# Patient Record
Sex: Male | Born: 1955 | Race: White | Hispanic: No | Marital: Married | Smoking: Never smoker
Health system: Southern US, Community
[De-identification: ages and names within clinical notes are randomized; demographics above are authoritative.]

## PROBLEM LIST (undated history)

## (undated) DIAGNOSIS — C189 Malignant neoplasm of colon, unspecified: Secondary | ICD-10-CM

## (undated) HISTORY — PX: EYE SURGERY: SHX253

## (undated) HISTORY — PX: SHOULDER ARTHROSCOPY: SHX128

## (undated) HISTORY — PX: COLONOSCOPY: SHX174

## (undated) HISTORY — DX: Malignant neoplasm of colon, unspecified: C18.9

## (undated) HISTORY — PX: COLON SURGERY: SHX602

## (undated) HISTORY — PX: KNEE ARTHROSCOPY: SUR90

---

## 2000-12-02 DIAGNOSIS — C189 Malignant neoplasm of colon, unspecified: Secondary | ICD-10-CM

## 2000-12-02 HISTORY — DX: Malignant neoplasm of colon, unspecified: C18.9

## 2001-06-19 ENCOUNTER — Encounter: Payer: Self-pay | Admitting: Gastroenterology

## 2001-06-19 ENCOUNTER — Encounter (INDEPENDENT_AMBULATORY_CARE_PROVIDER_SITE_OTHER): Payer: Self-pay | Admitting: Specialist

## 2001-06-19 ENCOUNTER — Inpatient Hospital Stay (HOSPITAL_COMMUNITY): Admission: EM | Admit: 2001-06-19 | Discharge: 2001-06-26 | Payer: Self-pay | Admitting: Gastroenterology

## 2001-06-19 ENCOUNTER — Encounter (INDEPENDENT_AMBULATORY_CARE_PROVIDER_SITE_OTHER): Payer: Self-pay

## 2001-07-10 ENCOUNTER — Ambulatory Visit (HOSPITAL_COMMUNITY): Admission: RE | Admit: 2001-07-10 | Discharge: 2001-07-10 | Payer: Self-pay | Admitting: Oncology

## 2001-07-10 ENCOUNTER — Encounter: Payer: Self-pay | Admitting: Oncology

## 2001-07-17 ENCOUNTER — Ambulatory Visit (HOSPITAL_BASED_OUTPATIENT_CLINIC_OR_DEPARTMENT_OTHER): Admission: RE | Admit: 2001-07-17 | Discharge: 2001-07-17 | Payer: Self-pay | Admitting: Surgery

## 2001-07-17 ENCOUNTER — Encounter: Payer: Self-pay | Admitting: Surgery

## 2002-01-22 ENCOUNTER — Ambulatory Visit (HOSPITAL_BASED_OUTPATIENT_CLINIC_OR_DEPARTMENT_OTHER): Admission: RE | Admit: 2002-01-22 | Discharge: 2002-01-22 | Payer: Self-pay | Admitting: Surgery

## 2002-06-28 ENCOUNTER — Ambulatory Visit (HOSPITAL_COMMUNITY): Admission: RE | Admit: 2002-06-28 | Discharge: 2002-06-28 | Payer: Self-pay | Admitting: Gastroenterology

## 2004-12-27 ENCOUNTER — Ambulatory Visit: Payer: Self-pay | Admitting: Gastroenterology

## 2005-01-03 ENCOUNTER — Ambulatory Visit: Payer: Self-pay | Admitting: Gastroenterology

## 2009-12-29 ENCOUNTER — Encounter (INDEPENDENT_AMBULATORY_CARE_PROVIDER_SITE_OTHER): Payer: Self-pay | Admitting: *Deleted

## 2010-01-04 ENCOUNTER — Encounter (INDEPENDENT_AMBULATORY_CARE_PROVIDER_SITE_OTHER): Payer: Self-pay | Admitting: *Deleted

## 2010-01-12 ENCOUNTER — Encounter (INDEPENDENT_AMBULATORY_CARE_PROVIDER_SITE_OTHER): Payer: Self-pay | Admitting: *Deleted

## 2010-01-15 ENCOUNTER — Ambulatory Visit: Payer: Self-pay | Admitting: Gastroenterology

## 2010-01-22 ENCOUNTER — Ambulatory Visit: Payer: Self-pay | Admitting: Gastroenterology

## 2010-01-23 ENCOUNTER — Ambulatory Visit: Payer: Self-pay | Admitting: Oncology

## 2011-01-01 NOTE — Miscellaneous (Signed)
Summary: rec col...as.  Clinical Lists Changes  Medications: Added new medication of MOVIPREP 100 GM  SOLR (PEG-KCL-NACL-NASULF-NA ASC-C) As directed - Signed Rx of MOVIPREP 100 GM  SOLR (PEG-KCL-NACL-NASULF-NA ASC-C) As directed;  #1 x 0;  Signed;  Entered by: Clide Cliff RN;  Authorized by: Louis Meckel MD;  Method used: Electronically to [facility]*, [address] [place], [place]  [address], Ph: [phone], Fax: [phone] Observations: Added new observation of ALLERGY REV: Done (01/15/2010 12:45)    Prescriptions: MOVIPREP 100 GM  SOLR (PEG-KCL-NACL-NASULF-NA ASC-C) As directed  #1 x 0   Entered by:   Clide Cliff RN   Authorized by:   Louis Meckel MD   Signed by:   Clide Cliff RN on 01/15/2010   Method used:   Electronically to        [facility]* (retail)       [address] [place], [place]  [address]       Ph: [phone]       Fax: [phone]   RxID:   [id number]

## 2011-01-01 NOTE — Letter (Signed)
Summary: Colonoscopy Letter  [facility]  [address] [place], [place] [address]   Phone: [phone]  Fax: [phone]      December 29, 2009 MRN: [id number]   Trevor Contreras [address] [place], [place]  [address]   Dear Mr. Remigio,   According to your medical record, it is time for you to schedule a Colonoscopy. The American Cancer Society recommends this procedure as a method to detect early colon cancer. Patients with a family history of colon cancer, or a personal history of colon polyps or inflammatory bowel disease are at increased risk.  This letter has beeen generated based on the recommendations made at the time of your procedure. If you feel that in your particular situation this may no longer apply, please contact our office.  Please call our office at [phone] to schedule this appointment or to update your records at your earliest convenience.  Thank you for cooperating with [place] to provide you with the very best care possible.   Sincerely,  Barbette Hair. Arlyce Dice, M.D.  [facility] Gastroenterology Division [phone]

## 2011-01-01 NOTE — Letter (Signed)
Summary: Previsit letter  [facility]  [address] [place], [place] [address]   Phone: [phone]  Fax: [phone]       01/04/2010 MRN: [id number]  Trevor Contreras [address] [place], [place]  [address]  Dear Mr. Pinon,  Welcome to the Gastroenterology Division at [facility].    You are scheduled to see a nurse for your pre-procedure visit on 01/15/2010 at 1:00PM on the 3rd floor at [facility], [address]  We ask that you try to arrive at our office 15 minutes prior to your appointment time to allow for check-in.  Your nurse visit will consist of discussing your medical and surgical history, your immediate family medical history, and your medications.    Please bring a complete list of all your medications or, if you prefer, bring the medication bottles and we will list them.  We will need to be aware of both prescribed and over the counter drugs.  We will need to know exact dosage information as well.  If you are on blood thinners (Coumadin, Plavix, Aggrenox, Ticlid, etc.) please call our office today/prior to your appointment, as we need to consult with your physician about holding your medication.   Please be prepared to read and sign documents such as consent forms, a financial agreement, and acknowledgement forms.  If necessary, and with your consent, a friend or relative is welcome to sit-in on the nurse visit with you.  Please bring your insurance card so that we may make a copy of it.  If your insurance requires a referral to see a specialist, please bring your referral form from your primary care physician.  No co-pay is required for this nurse visit.     If you cannot keep your appointment, please call [phone] to cancel or reschedule prior to your appointment date.  This allows [place] the opportunity to schedule an appointment for another patient in need of care.    Thank you for choosing [facility] for your medical  needs.  We appreciate the opportunity to care for you.  Please visit [place] at our website  to learn more about our practice.                     Sincerely.                                                                                                                   The Gastroenterology Division

## 2011-01-01 NOTE — Procedures (Signed)
Summary: Colonoscopy  Patient: Trevor Contreras Note: All result statuses are Final unless otherwise noted.  Tests: (1) Colonoscopy (COL)   COL Colonoscopy           DONE     [facility]     [address]     [place], [place]  [address]           COLONOSCOPY PROCEDURE REPORT           PATIENT:  Trevor, Contreras  MR#:  [id number]     BIRTHDATE:  1956/09/21, 53 yrs. old  GENDER:  male           ENDOSCOPIST:  Quinten Allerton. Arlyce Dice, MD     Referred by:           PROCEDURE DATE:  01/22/2010     PROCEDURE:  Colonoscopy, Diagnostic     ASA CLASS:  Class II     INDICATIONS:  history of colon cancer Index tumor removed 2002           MEDICATIONS:   Fentanyl 100 mcg IV, Versed 10 mg IV, Benadryl 25     mg IV           DESCRIPTION OF PROCEDURE:   After the risks benefits and     alternatives of the procedure were thoroughly explained, informed     consent was obtained.  Digital rectal exam was performed and     revealed no abnormalities.   The LB CF-[id number] endoscope     was introduced through the anus and advanced to the anastamosis,     without limitations.  The quality of the prep was excellent, using     MoviPrep.  The instrument was then slowly withdrawn as the colon     was fully examined.     <<PROCEDUREIMAGES>>           FINDINGS:  A normal appearing cecum, ileocecal valve, and     appendiceal orifice were identified. The ascending, hepatic     flexure, transverse, splenic flexure, descending, sigmoid colon,     and rectum appeared unremarkable (see image1, image2, image3,     image4, image5, image7, image10, image13, image14, and image16).     Retroflexed views in the rectum revealed no abnormalities.    The     scope was then withdrawn from the patient and the procedure     completed.           COMPLICATIONS:  None           ENDOSCOPIC IMPRESSION:     1) Normal colon     RECOMMENDATIONS:     1) colonoscopy     5 years           REPEAT EXAM:  In 5 year(s) for  Colonoscopy.           ______________________________     Barbette Hair. Arlyce Dice, MD           CC:  Rolm Baptise, MDDonald Christell Constant, MD           n.     Rosalie Doctor:   Barbette Hair. Kaplan at 01/22/2010 10:50 AM           Ashley Jacobs, [id number]  Note: An exclamation mark (!) indicates a result that was not dispersed into the flowsheet. Document Creation Date: 01/22/2010 10:51 AM _______________________________________________________________________  (1) Order result status: Final Collection or observation date-time: 01/22/2010 10:40 Requested date-time:  Receipt date-time:  Reported date-time:  Referring Physician:   Ordering Physician: Melvia Heaps [id number]) Specimen Source:  Source: Launa Grill Order Number: [id number] Lab site:   Appended Document: Colonoscopy    Clinical Lists Changes  Observations: Added new observation of COLONNXTDUE: 01/2015 (01/22/2010 11:06)      Appended Document: Colonoscopy     Procedures Next Due Date:    Colonoscopy: 01/2015

## 2011-01-01 NOTE — Letter (Signed)
Summary: [facility] Instructions  [facility]  [address] [place], [place] [address]   Phone: [phone]  Fax: [phone]       Trevor Contreras    08-31-1956    MRN: [id number]        Procedure Day Dorna Bloom:  Duanne Limerick  01/22/10     Arrival Time:  9:00AM     Procedure Time:  10:00AM     Location of Procedure:                    Juliann Pares _  [facility] (4th Floor)                        PREPARATION FOR COLONOSCOPY WITH MOVIPREP   Starting 5 days prior to your procedure 01/17/10 do not eat nuts, seeds, popcorn, corn, beans, peas,  salads, or any raw vegetables.  Do not take any fiber supplements (e.g. Metamucil, Citrucel, and Benefiber).  THE DAY BEFORE YOUR PROCEDURE         DATE: 01/21/10  DAY:  SUNDAY  1.  Drink clear liquids the entire day-NO SOLID FOOD  2.  Do not drink anything colored red or purple.  Avoid juices with pulp.  No orange juice.  3.  Drink at least 64 oz. (8 glasses) of fluid/clear liquids during the day to prevent dehydration and help the prep work efficiently.  CLEAR LIQUIDS INCLUDE: Water Jello Ice Popsicles Tea (sugar ok, no milk/cream) Powdered fruit flavored drinks Coffee (sugar ok, no milk/cream) Gatorade Juice: apple, white grape, white cranberry  Lemonade Clear bullion, consomm, broth Carbonated beverages (any kind) Strained chicken noodle soup Hard Candy                             4.  In the morning, mix first dose of MoviPrep solution:    Empty 1 Pouch A and 1 Pouch B into the disposable container    Add lukewarm drinking water to the top line of the container. Mix to dissolve    Refrigerate (mixed solution should be used within 24 hrs)  5.  Begin drinking the prep at 5:00 p.m. The MoviPrep container is divided by 4 marks.   Every 15 minutes drink the solution down to the next mark (approximately 8 oz) until the full liter is complete.   6.  Follow completed prep with 16 oz of clear liquid of your choice (Nothing  red or purple).  Continue to drink clear liquids until bedtime.  7.  Before going to bed, mix second dose of MoviPrep solution:    Empty 1 Pouch A and 1 Pouch B into the disposable container    Add lukewarm drinking water to the top line of the container. Mix to dissolve    Refrigerate  THE DAY OF YOUR PROCEDURE      DATE: 01/22/10  DAY: MONDAY  Beginning at 5:00AM (5 hours before procedure):         1. Every 15 minutes, drink the solution down to the next mark (approx 8 oz) until the full liter is complete.  2. Follow completed prep with 16 oz. of clear liquid of your choice.    3. You may drink clear liquids until 8:00AM (2 HOURS BEFORE PROCEDURE).   MEDICATION INSTRUCTIONS  Unless otherwise instructed, you should take regular prescription medications with a small sip of water   as early as possible the  morning of your procedure.         OTHER INSTRUCTIONS  You will need a responsible adult at least 57 years of age to accompany you and drive you home.   This person must remain in the waiting room during your procedure.  Wear loose fitting clothing that is easily removed.  Leave jewelry and other valuables at home.  However, you may wish to bring a book to read or  an iPod/MP3 player to listen to music as you wait for your procedure to start.  Remove all body piercing jewelry and leave at home.  Total time from sign-in until discharge is approximately 2-3 hours.  You should go home directly after your procedure and rest.  You can resume normal activities the  day after your procedure.  The day of your procedure you should not:   Drive   Make legal decisions   Operate machinery   Drink alcohol   Return to work  You will receive specific instructions about eating, activities and medications before you leave.    The above instructions have been reviewed and explained to me by   Clide Cliff, RN_______________________    I fully understand and can  verbalize these instructions _____________________________ Date _________

## 2011-04-19 NOTE — Op Note (Signed)
[facility]  Patient:    Trevor Contreras, Trevor Contreras Visit Number: [id number] MRN: [id number]          Service Type: DSU Location: [facility] Attending Physician:  Charlton Haws Dictated by:   Currie Paris, M.D. Proc. Date: 01/22/02 Admit Date:  01/22/2002                             Operative Report  PREOPERATIVE DIAGNOSIS:  Unneeded Port-A-Cath.  POSTOPERATIVE DIAGNOSIS:  Unneeded Port-A-Cath.  OPERATION:  Removal of Port-A-Cath.  SURGEON:  Currie Paris, M.D.  ANESTHESIA:  MAC.  CLINICAL HISTORY:  This patient is a 55 year old, who has finished chemotherapy for colon cancer and needed to have his IV access removed.  DESCRIPTION OF PROCEDURE:  The patient was seen in the holding area and had no further questions.  He was taken to the operating room and given some IV sedation.  The area was prepped and draped.  A combination of 1% plain Xylocaine mixed equally with 0.5% Marcaine with epinephrine was used for local and infiltrated around the Port-A-Cath.  The old scar was excised and the Port-A-Cath reservoir identified in its little pocket.  The tubing was backed out partially and a 3-0 Vicryl used to occlude the tubing tract.  The tubing was then removed completely and the purse-string tied down.  The three Prolene sutures holding the Port-A-Cath in were cut and the Port-A-Cath removed intact.  The site was then cauterized a little bit to get the cavity to be a little more adherent and then closed in layers with 3-0 Vicryl followed by 4-0 Monocryl subcuticular plus Steri-Strips.  The patient tolerated the procedure well.  There were no operative complications.  All counts were correct. Dictated by:   Currie Paris, M.D. Attending Physician:  Charlton Haws DD:  01/22/02 TD:  01/22/02 Job: [id number]

## 2011-04-19 NOTE — Discharge Summary (Signed)
[facility]  Patient:    Trevor Contreras, Trevor Contreras                          MRN: [id number] Adm. Date:  [id number] Disc. Date: [id number] Attending:  Charlton Haws CC:         Barbette Hair. Arlyce Dice, M.D. [facility]  Monica Becton, M.D.  Leighton Roach. Truett Perna, M.D.   Discharge Summary  ACCOUNT NUMBER:  [id number]  FINAL DIAGNOSES: 1. Carcinoma of the ascending colon (T3, N0, M0). 2. Blood loss anemia secondary to #1.  CLINICAL HISTORY:  Mr. Thain presented to the physician and was admitted on July 19 with fatigue, shortness of breath, and was found to have a marked anemia.  Details are noted in the admission note.  He was begun on transfusions and, the next day, colonoscopy was performed which showed carcinoma of the ascending colon.  He was seen in surgical consultation and because his bowel prep was completed, he was taken to the operating room on Sunday, July 21, where a right hemicolectomy was done.  At the time of surgery, there was no evidence for any metastatic disease.  Postoperatively, he had an uneventful course except for he had a little nausea the first night but cleared up.  He is managed on PCA Dilaudid.  He began passing gas within two days of surgery, was able to start on some sips of liquids and gradually advanced to a solid diet.  By the fifth postoperative day, he had remained afebrile, was tolerating oral pain medicines tolerating a solid diet, having bowel movements, and able to go home.  He was seen in oncology consultation with follow-up plans made and possible chemotherapy as may be needed after further evaluation and discussion.  Pathology did show a T3 lesion, negative nodes.  This was discussed and reviewed with the patient.  Laboratory studies this admission included as noted his initial hemoglobin around 6.  After four units of blood and just prior to discharge, his hemoglobin had come up to 10.1.  The first reported hemoglobin here  was actually 5.7.  Electrolytes were normal on admission.  Pro time and PTT were unremarkable.  Liver functions were normal.  Iron was less than 10.  CEA was 0.5.  Urinalysis was clear.  EKG showed sinus bradycardia but was otherwise unremarkable.  Chest x-ray done on admission showed no evidence for metastatic disease or other cardiopulmonary issues.  The patient is discharged in satisfactory condition, limited activities, follow-up in my office in two weeks.  Follow up by Dr. Danielle Dess office as they will arrange.  He is given Tylox for pain. DD:  06/26/01 TD:  06/27/01 Job: [id number]

## 2011-04-19 NOTE — H&P (Signed)
[facility]  Patient:    Trevor Contreras, Trevor Contreras                          MRN: [id number] Adm. Date:  06/19/01 Attending:  Barbette Hair. Arlyce Dice, M.D. [facility] Dictator:   Mike Gip, P.A.-C. CC:         Monica Becton, M.D. [facility])   History and Physical  DATE OF BIRTH:  [birth year].  CHIEF COMPLAINT:  Fatigue and exertional shortness of breath with intermittent palpitations.  HISTORY:  Trevor Contreras is a healthy 55 year old white male, medical patient of Dr. Christell Constant in [place], with no known history of chronic medical problems, who presented to the primary care providers about three months ago with general complaints of fatigue.  He did have some laboratories drawn at that time which were unremarkable, but no CBC was done.  Now, with progressive complaint of exertional shortness of breath, fatigue, and decreased exercise tolerance.  He has been a runner long-term, usually running around four miles per day.  He says he has been able to run "to the end of the driveway" over the past several weeks and has been trying to use his treadmill, but gets very short of breath and fatigued and is unable to go beyond one-quarter of a mile.  He has had no definite chest pain, but has had some discomfort between his shoulder blades at times.  He was seen in Dr. Buel Ream office today because of his progressive fatigue.  A CBC was done showing a hemoglobin of 6 and hematocrit of 20.6, MCV of 59.7, MCHC of 29.2, MCH of 17.4, platelet count 234, WBC 4.2. GI was called and the patient is admitted at this time for diagnostic evaluation.  He has no complaints of abdominal pain, change in his bowel habits, nausea, vomiting, dysphagia, heartburn, indigestion.  His appetite has been fine and his weight is stable.  He has not noted any change in the consistency of this stools.  He says that he has seen some darker bowel movements at times, but had attributed this to diet and had not  definitely seen any melena or hematochezia.  He was hemoccult negative today in Dr. Buel Ream office.  The patient does complain of intermittent palpitations, which "jolt him".  He says this has been going on intermittently for over a year and that he did apparently have a Holter done at one time which was negative.  He says the palpitations have been more frequent over the past couple of months.  He also notes that he has been craving ice over the past few months and has to have a cup of ice by him at all times.  CURRENT MEDICATIONS:  None regular.  No regular aspirin or NSAIDs.  ALLERGIES:  No known drug allergies.  PAST MEDICAL HISTORY: 1. Left knee arthroscopy. 2. Current asymptomatic left inguinal hernia.  FAMILY HISTORY:  Father deceased with lung cancer.  There is no family history of colon cancer or polyps or anemia.  SOCIAL HISTORY:  The patient is married.  He has two children.  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  No tobacco and no ETOH. Again, he has been a runner long-term.  REVIEW OF SYSTEMS:  CARDIOVASCULAR:  Negative for chest pain or anginal symptoms.  PULMONARY:  Negative for cough. The patient does have exertional dyspnea.  No sputum production.  GENITOURINARY:  Negative dysuria, urgency, or frequency.  MUSCULOSKELETAL:  Negative for arthralgias or myalgias.  PHYSICAL EXAMINATION:  GENERAL:  Well-developed white male in no acute distress.  He is alert and oriented x 3.  He is pale.  VITAL SIGNS:  Temperature 97.6, blood pressure 108/60, pulse 63.  O2 saturation 99%.  HEENT:  Nontraumatic, normocephalic.  EOMI.  PERRLA.  Sclerae anicteric.  NECK:  Supple, without nodes.  No JVD or bruits.  CARDIOVASCULAR:  Regular rate and rhythm, with S1 and S2.  PULMONARY:  Clear to A&P.  ABDOMEN:  Soft.  Bowel sounds are active.  He is nontender.  There is no palpable mass or hepatosplenomegaly.  RECTAL:  Heme negative earlier today and not  repeated.  EXTREMITIES:  Without clubbing, cyanosis, or edema.  NEUROLOGICAL:  Grossly nonfocal.  LABORATORY DATA:  Pending.  IMPRESSION: 1. A 55 year old white male with severe microcytic anemia, symptomatic with    fatigue, exertional dyspnea, and increased palpitations.  Rule out occult    colon or gastric lesion or small-bowel lesion, i.e. AVMs, etc. Rule out    nongastrointestinal course with myelodysplastic process. 2. Palpitations.  Question premature ventricular contractions, question    intermittent arrhythmia. 3. Status post left knee arthroscopy. 4. Left inguinal hernia. 5. Pica, with ice craving secondary to iron deficiency.  PLAN:  The patient is admitted to the service of Dr. Melvia Heaps.  He will be transfused two units of packed rbcs.  We will check baseline laboratories, including iron studies prior to transfusion.  We will plan colonoscopy and upper endoscopy in a.m. with Dr. Arlyce Dice.  If this is negative, he will need small-bowel evaluation and if this is negative, then hematology consultation. The patient will be placed on telemetry.  If we document an arrhythmia, we will obtain cardiology consultation.  Check chest x-ray.  For further details, please see the orders. DD:  06/19/01 TD:  06/20/01 Job: [id number]

## 2011-04-19 NOTE — Consult Note (Signed)
[facility]  Patient:    Trevor Contreras, Trevor Contreras                          MRN: [id number] Proc. Date: 06/20/01 Adm. Date:  [id number] Attending:  Judeth Cornfield CC:         Barbette Hair. Arlyce Dice, M.D. [facility]  Monica Becton, M.D.   Consultation Report  ACCOUNT NUMBER:  [id number]  REASON FOR CONSULTATION:  Cancer of the right colon.  CLINICAL HISTORY:  I was asked to see Trevor Contreras because of recently found cancer of the right colon.  Trevor Contreras is a 55 year old gentleman who has had gradual onset of fatigue. Around the first of the year, he was able to run four miles without problem but gradually was able to run less and less and became more and more short of breath when he was running, finally was not able to run even a mile; he also developed a craving for ice.  He has had no abdominal pain.  He has noticed no blood in his stools.  He has not thrown up any blood.  He has had no other GI symptoms, no change in his bowel habits, etc.  He has developed progressive exertional shortness of breath and fatigue, was evaluated and found to have a marked anemia with a hemoglobin of approximately 6.  He was admitted for diagnostic evaluation and therapy.  MEDICATIONS:  He has been on no medications.  ALLERGIES:  He has no known allergies.  PAST SURGICAL HISTORY:  He did have a left knee arthroscopy.  REVIEW OF SYSTEMS:  HEENT:  Negative.  CHEST:  No cough or shortness of breath.  HEART:  Negative.  No history of heart murmur, although he did have some recent development of palpitations.  ABDOMEN:  He does note a left inguinal hernia.  FAMILY HISTORY:  He had a father who died of lung cancer but otherwise negative.  PHYSICAL EXAMINATION:  GENERAL:  Patient is a well-developed, well-nourished man who is in no distress.  VITAL SIGNS:  Vital signs have been stable since admission.  His pulse is slow and regular.  HEENT:  Head is normocephalic.  Eyes nonicteric.   Pupils round and regular.  NECK:  Supple.  No masses or thyromegaly.  LUNGS:  Clear to auscultation.  HEART:  Regular.  No murmurs, rubs, or gallops.  ABDOMEN:  Soft and completely nontender.  No masses or organomegaly.  Markedly increased bowel sounds (probably secondary to the recent colonoscopy this morning).  EXTREMITIES:  No cyanosis or edema.  LABORATORY STUDIES:  Laboratory studies other than the anemia are basically unremarkable.  Colonoscopic findings are noted with Dr. Barbette Hair. Arlyce Dice and he has a lesion in the ascending colon which appears to be malignant, approximately 3 cm in size.  Biopsies were taken.  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  RECOMMENDATION:  He will need a right colectomy.  Whether or not this is malignant, it will need to come out as he has developed anemia from this. From the photos, it does appear to me to be malignant.  I have discussed the surgery including right colectomy, the risks and complications including bleeding, infection, leaking anastomoses, bowel obstruction, etc, as well as cardiac and pulmonary complications that might occur.  All questions are answered and he is anxious to proceed to surgery.  I have told him without surgery, he will continue to have blood loss and will  eventually have obstruction-type problems.  We will go ahead and schedule him tomorrow (Sunday).  He has already had a bowel prep and I think that Mondays schedule looks full and would be just as prudent to go ahead and get him done before we have additional blood loss. DD:  06/20/01 TD:  06/22/01 Job: [id number] [id number]

## 2013-05-27 ENCOUNTER — Telehealth: Payer: Self-pay | Admitting: Physician Assistant

## 2013-05-31 ENCOUNTER — Ambulatory Visit (INDEPENDENT_AMBULATORY_CARE_PROVIDER_SITE_OTHER): Payer: BC Managed Care – PPO | Admitting: Physician Assistant

## 2013-05-31 ENCOUNTER — Encounter: Payer: Self-pay | Admitting: Physician Assistant

## 2013-05-31 VITALS — BP 112/72 | HR 64 | Temp 98.0°F | Wt 206.0 lb

## 2013-05-31 DIAGNOSIS — D649 Anemia, unspecified: Secondary | ICD-10-CM

## 2013-05-31 DIAGNOSIS — C189 Malignant neoplasm of colon, unspecified: Secondary | ICD-10-CM | POA: Insufficient documentation

## 2013-05-31 DIAGNOSIS — R0602 Shortness of breath: Secondary | ICD-10-CM

## 2013-05-31 DIAGNOSIS — R5383 Other fatigue: Secondary | ICD-10-CM

## 2013-05-31 DIAGNOSIS — R5381 Other malaise: Secondary | ICD-10-CM

## 2013-05-31 LAB — POCT CBC
Granulocyte percent: 60.7 %G (ref 37–80)
HCT, POC: 39.4 % — AB (ref 43.5–53.7)
Hemoglobin: 14 g/dL — AB (ref 14.1–18.1)
Lymph, poc: 1.7 (ref 0.6–3.4)
MCH, POC: 31 pg (ref 27–31.2)
MCHC: 35.7 g/dL — AB (ref 31.8–35.4)
MCV: 87.2 fL (ref 80–97)
MPV: 8.1 fL (ref 0–99.8)
POC Granulocyte: 3.3 (ref 2–6.9)
POC LYMPH PERCENT: 30.9 %L (ref 10–50)
Platelet Count, POC: 148 10*3/uL (ref 142–424)
RBC: 4.5 M/uL — AB (ref 4.69–6.13)
RDW, POC: 14.3 %
WBC: 5.5 10*3/uL (ref 4.6–10.2)

## 2013-05-31 NOTE — Patient Instructions (Signed)

## 2013-05-31 NOTE — Progress Notes (Signed)
Subjective:     Patient ID: Trevor Contreras, male   DOB: [birth year], 57 y.o.   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       Objective:   Physical Exam  Nursing note and vitals reviewed. NAD No bruits/JVD Heart- RRR w/o M Lungs- CTA bilat Pulses equal in upper ext No lower ext edema CBC- nl in office BMP, LFT, TSH pending     Assessment:     1. Shortness of breath   2. Other malaise and fatigue   3. Colon cancer        Plan:     Cont to observe If pt cont with increase in irreg heartbeat bouts- stress testing Will inform of lab results Keep well hydrated F/U pending labs

## 2013-06-01 DIAGNOSIS — D649 Anemia, unspecified: Secondary | ICD-10-CM | POA: Insufficient documentation

## 2013-06-01 LAB — BASIC METABOLIC PANEL WITH GFR
BUN: 23 mg/dL (ref 6–23)
CO2: 27 mEq/L (ref 19–32)
Calcium: 9.5 mg/dL (ref 8.4–10.5)
Chloride: 104 mEq/L (ref 96–112)
Creat: 0.91 mg/dL (ref 0.50–1.35)
GFR, Est African American: >89 mL/min
GFR, Est Non African American: >89 mL/min
Glucose, Bld: 86 mg/dL (ref 70–99)
Potassium: 5.1 mEq/L (ref 3.5–5.3)
Sodium: 137 mEq/L (ref 135–145)

## 2013-06-01 LAB — HEPATIC FUNCTION PANEL
ALT: 19 U/L (ref 0–53)
AST: 20 U/L (ref 0–37)
Albumin: 4 g/dL (ref 3.5–5.2)
Alkaline Phosphatase: 67 U/L (ref 39–117)
Bilirubin, Direct: 0.1 mg/dL (ref 0.0–0.3)
Indirect Bilirubin: 0.5 mg/dL (ref 0.0–0.9)
Total Bilirubin: 0.6 mg/dL (ref 0.3–1.2)
Total Protein: 6.6 g/dL (ref 6.0–8.3)

## 2013-06-01 NOTE — Addendum Note (Signed)
Addended by: Inis Sizer on: 06/01/2013 12:45 PM   Modules accepted: Orders

## 2013-07-05 ENCOUNTER — Telehealth: Payer: Self-pay | Admitting: *Deleted

## 2013-07-05 NOTE — Telephone Encounter (Signed)
Called to follow-up on patient from previous visit with Montey Hora, PA-C.   Patient was suppose to see his GI specialist and I want to see if he has.   Left message for patient to return call.

## 2013-07-05 NOTE — Telephone Encounter (Signed)
Spoke with patient and he is doing well. He has not follow-up with his GI specialist yet. He will let us know if he needs any further assistance.

## 2014-07-19 ENCOUNTER — Ambulatory Visit (INDEPENDENT_AMBULATORY_CARE_PROVIDER_SITE_OTHER): Payer: BC Managed Care – PPO | Admitting: Family

## 2014-07-19 ENCOUNTER — Encounter: Payer: Self-pay | Admitting: Family

## 2014-07-19 VITALS — BP 116/72 | HR 60 | Temp 97.5°F | Ht 73.0 in | Wt 216.0 lb

## 2014-07-19 DIAGNOSIS — H8110 Benign paroxysmal vertigo, unspecified ear: Secondary | ICD-10-CM

## 2014-07-19 DIAGNOSIS — Z125 Encounter for screening for malignant neoplasm of prostate: Secondary | ICD-10-CM

## 2014-07-19 DIAGNOSIS — Z1321 Encounter for screening for nutritional disorder: Secondary | ICD-10-CM

## 2014-07-19 LAB — POCT GLYCOSYLATED HEMOGLOBIN (HGB A1C): HEMOGLOBIN A1C: 5.5

## 2014-07-19 MED ORDER — MECLIZINE HCL 25 MG PO TABS: 25.0000 mg | ORAL_TABLET | Freq: Three times a day (TID) | ORAL | Status: DC | PRN

## 2014-07-19 NOTE — Progress Notes (Signed)
   Subjective:    Patient ID: Kebron Pulse, male    DOB: November 21, 1956, 58 y.o.   MRN: [id number]  Dizziness This is a new problem. The current episode started today. The problem occurs intermittently. The problem has been unchanged. Associated symptoms include congestion, headaches and vertigo. Pertinent negatives include no abdominal pain, chest pain, chills, coughing, fatigue, fever, joint swelling, nausea, sore throat, urinary symptoms or vomiting. He has tried nothing for the symptoms. The treatment provided no relief.    *Pt states this AM he was "seeing double" and felt sweaty.   Review of Systems  Constitutional: Negative.  Negative for fever, chills and fatigue.  HENT: Positive for congestion. Negative for sore throat.   Respiratory: Negative.  Negative for cough.   Cardiovascular: Negative.  Negative for chest pain.  Gastrointestinal: Negative.  Negative for nausea, vomiting and abdominal pain.  Endocrine: Negative.   Genitourinary: Negative.   Musculoskeletal: Negative.  Negative for joint swelling.  Neurological: Positive for dizziness, vertigo and headaches.  Hematological: Negative.   Psychiatric/Behavioral: Negative.   All other systems reviewed and are negative.      Objective:   Physical Exam  Vitals reviewed. Constitutional: He is oriented to person, place, and time. He appears well-developed and well-nourished. No distress.  HENT:  Head: Normocephalic.  Right Ear: External ear normal.  Left Ear: External ear normal.  Nose: Nose normal.  Mouth/Throat: Oropharynx is clear and moist.  Eyes: Pupils are equal, round, and reactive to light. Right eye exhibits no discharge. Left eye exhibits no discharge.  Neck: Normal range of motion. Neck supple. No thyromegaly present.  Cardiovascular: Normal rate, regular rhythm, normal heart sounds and intact distal pulses.   No murmur heard. Pulmonary/Chest: Effort normal and breath sounds normal. No respiratory distress. He has  no wheezes.  Abdominal: Soft. Bowel sounds are normal. He exhibits no distension. There is no tenderness.  Musculoskeletal: Normal range of motion. He exhibits no edema and no tenderness.  Neurological: He is alert and oriented to person, place, and time. He has normal reflexes. No cranial nerve deficit.  Skin: Skin is warm and dry. No rash noted. No erythema.  Psychiatric: He has a normal mood and affect. His behavior is normal. Judgment and thought content normal.    BP 116/72  Pulse 60  Temp(Src) 97.5 F (36.4 C) (Oral)  Ht $R'6\' 1"'yu$  (1.854 m)  Wt 216 lb (97.977 kg)  BMI 28.50 kg/m2       Assessment & Plan:  1. Benign paroxysmal positional vertigo, unspecified laterality - CMP14+EGFR - meclizine (ANTIVERT) 25 MG tablet; Take 1 tablet (25 mg total) by mouth 3 (three) times daily as needed for dizziness.  Dispense: 30 tablet; Refill: 0  2. Prostate cancer screening - PSA, total and free  3. Encounter for vitamin deficiency screening - Vit D  25 hydroxy (rtn osteoporosis monitoring)   Labs pending Diet and exercise encouraged RTO for annual physical  Evelina Dun, FNP

## 2014-07-19 NOTE — Patient Instructions (Signed)

## 2014-07-20 LAB — ANEMIA PROFILE B
BASOS: 1 %
Basophils Absolute: 0.1 10*3/uL (ref 0.0–0.2)
EOS: 2 %
Eosinophils Absolute: 0.1 10*3/uL (ref 0.0–0.4)
Ferritin: 90 ng/mL (ref 30–400)
Folate: >19.9 ng/mL (ref >3.0)
HCT: 40.4 % (ref 37.5–51.0)
HEMOGLOBIN: 13.7 g/dL (ref 12.6–17.7)
IMMATURE GRANS (ABS): 0 10*3/uL (ref 0.0–0.1)
IMMATURE GRANULOCYTES: 0 %
Iron Saturation: 21 % (ref 15–55)
Iron: 67 ug/dL (ref 40–155)
LYMPHS ABS: 1.9 10*3/uL (ref 0.7–3.1)
Lymphs: 35 %
MCH: 29.5 pg (ref 26.6–33.0)
MCHC: 33.9 g/dL (ref 31.5–35.7)
MCV: 87 fL (ref 79–97)
MONOCYTES: 11 %
MONOS ABS: 0.6 10*3/uL (ref 0.1–0.9)
NEUTROS PCT: 51 %
Neutrophils Absolute: 2.8 10*3/uL (ref 1.4–7.0)
PLATELETS: 199 10*3/uL (ref 150–379)
RBC: 4.65 x10E6/uL (ref 4.14–5.80)
RDW: 14.3 % (ref 12.3–15.4)
Retic Ct Pct: 1.3 % (ref 0.6–2.6)
TIBC: 321 ug/dL (ref 250–450)
UIBC: 254 ug/dL (ref 150–375)
Vitamin B-12: 577 pg/mL (ref 211–946)
WBC: 5.5 10*3/uL (ref 3.4–10.8)

## 2014-07-20 LAB — CMP14+EGFR
ALBUMIN: 4.4 g/dL (ref 3.5–5.5)
ALT: 18 IU/L (ref 0–44)
AST: 19 IU/L (ref 0–40)
Albumin/Globulin Ratio: 1.9 (ref 1.1–2.5)
Alkaline Phosphatase: 61 IU/L (ref 39–117)
BUN / CREAT RATIO: 19 (ref 9–20)
BUN: 19 mg/dL (ref 6–24)
CALCIUM: 9.3 mg/dL (ref 8.7–10.2)
CHLORIDE: 99 mmol/L (ref 97–108)
CO2: 25 mmol/L (ref 18–29)
CREATININE: 1 mg/dL (ref 0.76–1.27)
GFR calc Af Amer: 95 mL/min/{1.73_m2} (ref >59)
GFR calc non Af Amer: 82 mL/min/{1.73_m2} (ref >59)
GLOBULIN, TOTAL: 2.3 g/dL (ref 1.5–4.5)
GLUCOSE: 88 mg/dL (ref 65–99)
Potassium: 4.7 mmol/L (ref 3.5–5.2)
Sodium: 140 mmol/L (ref 134–144)
TOTAL PROTEIN: 6.7 g/dL (ref 6.0–8.5)
Total Bilirubin: 0.4 mg/dL (ref 0.0–1.2)

## 2014-07-20 LAB — VITAMIN D 25 HYDROXY (VIT D DEFICIENCY, FRACTURES): Vit D, 25-Hydroxy: 44.3 ng/mL (ref 30.0–100.0)

## 2014-07-20 LAB — PSA, TOTAL AND FREE
PSA FREE PCT: 25.5 %
PSA FREE: 0.28 ng/mL
PSA: 1.1 ng/mL (ref 0.0–4.0)

## 2015-01-24 ENCOUNTER — Encounter: Payer: Self-pay | Admitting: Gastroenterology

## 2015-01-26 ENCOUNTER — Encounter: Payer: Self-pay | Admitting: Gastroenterology

## 2015-06-29 ENCOUNTER — Encounter: Payer: Self-pay | Admitting: Physician Assistant

## 2015-06-29 ENCOUNTER — Ambulatory Visit (INDEPENDENT_AMBULATORY_CARE_PROVIDER_SITE_OTHER): Payer: BC Managed Care – PPO | Admitting: Physician Assistant

## 2015-06-29 VITALS — BP 116/72 | HR 63 | Temp 97.3°F | Ht 73.0 in | Wt 213.0 lb

## 2015-06-29 DIAGNOSIS — B351 Tinea unguium: Secondary | ICD-10-CM | POA: Diagnosis not present

## 2015-06-29 DIAGNOSIS — J019 Acute sinusitis, unspecified: Secondary | ICD-10-CM | POA: Diagnosis not present

## 2015-06-29 DIAGNOSIS — J309 Allergic rhinitis, unspecified: Secondary | ICD-10-CM

## 2015-06-29 MED ORDER — EFINACONAZOLE 10 % EX SOLN: CUTANEOUS | Status: DC

## 2015-06-29 MED ORDER — MONTELUKAST SODIUM 10 MG PO TABS: 10.0000 mg | ORAL_TABLET | Freq: Every day | ORAL | Status: DC

## 2015-06-29 MED ORDER — FLUTICASONE PROPIONATE 50 MCG/ACT NA SUSP: 2.0000 | Freq: Every day | NASAL | Status: DC

## 2015-06-29 MED ORDER — AMOXICILLIN 875 MG PO TABS: 875.0000 mg | ORAL_TABLET | Freq: Two times a day (BID) | ORAL | Status: DC

## 2015-06-29 NOTE — Progress Notes (Signed)
   Subjective:    Patient ID: Trevor Contreras, male    DOB: [birth year], 59 y.o.   MRN: [id number]  HPI 59 y/o male presents with c/o nasal stuffiness and congestion x several months. He had an episode of severe dizziness 3 days ago and was taken by ambulance to [facility] CT scan showed sinusitis. He was not prescribed antibiotics, only Meclizine for the dizziness.   Patient also states that he has a h/o of afib and has an appt scheduled in August with a Cardiologist for further evaluation.     Review of Systems  Constitutional: Negative.   HENT: Positive for congestion (nasal ), postnasal drip, rhinorrhea, sinus pressure and sore throat.   Eyes: Negative.   Respiratory: Negative.   Cardiovascular: Negative.   Gastrointestinal: Negative.   Neurological: Positive for dizziness (past episodes ).       Objective:   Physical Exam  Constitutional: He is oriented to person, place, and time. He appears well-developed and well-nourished. No distress.  HENT:  Head: Normocephalic and atraumatic.  Right Ear: External ear normal.  Left Ear: External ear normal.  Injected posterior pharynx bilaterally  Nasal turbinates boggy and erythematous bilaterally   Cardiovascular: Normal rate, regular rhythm and normal heart sounds.  Exam reveals no gallop and no friction rub.   No murmur heard. Pulmonary/Chest: Effort normal and breath sounds normal. No respiratory distress. He has no wheezes. He has no rales. He exhibits no tenderness.  Musculoskeletal: He exhibits no edema or tenderness.  Neurological: He is alert and oriented to person, place, and time.  Skin: He is not diaphoretic.  Psychiatric: He has a normal mood and affect. His behavior is normal. Judgment and thought content normal.  Nursing note and vitals reviewed.         Assessment & Plan:  1. Onychomycosis of toenail  - Efinaconazole 10 % SOLN; Apply q day to nail x 48 weeks or until fungus clears  Dispense: 1  Bottle; Refill: 1  2. Acute sinusitis, recurrence not specified, unspecified location  - amoxicillin (AMOXIL) 875 MG tablet; Take 1 tablet (875 mg total) by mouth 2 (two) times daily.  Dispense: 20 tablet; Refill: 0  3. Allergic rhinitis, unspecified allergic rhinitis type  - montelukast (SINGULAIR) 10 MG tablet; Take 1 tablet (10 mg total) by mouth at bedtime.  Dispense: 30 tablet; Refill: 3 - fluticasone (FLONASE) 50 MCG/ACT nasal spray; Place 2 sprays into both nostrils daily.  Dispense: 16 g; Refill: 6   RTO prn if symptoms worsen or dni   Jaelin Fackler A. Benjamin Stain PA-C

## 2015-07-11 ENCOUNTER — Telehealth: Payer: Self-pay

## 2015-07-11 ENCOUNTER — Other Ambulatory Visit: Payer: Self-pay | Admitting: Physician Assistant

## 2015-07-11 DIAGNOSIS — B351 Tinea unguium: Secondary | ICD-10-CM

## 2015-07-11 MED ORDER — CICLOPIROX 8 % EX SOLN: Freq: Every day | CUTANEOUS | Status: DC

## 2015-07-11 NOTE — Telephone Encounter (Signed)
Prescription for Cicloprox sent to pharmacy  Bolton Canupp A. Benjamin Stain PA-C

## 2015-07-11 NOTE — Telephone Encounter (Signed)
Insurance denied prior authorization for Jublia  Must try and fail TWo of the following agents  Oral terbinafine oral itraconazole or topical cuclopirox solution

## 2015-07-12 NOTE — Telephone Encounter (Signed)
Patient aware that a new rx was sent to pharmacy.

## 2015-08-19 ENCOUNTER — Ambulatory Visit (INDEPENDENT_AMBULATORY_CARE_PROVIDER_SITE_OTHER): Payer: BC Managed Care – PPO | Admitting: Family

## 2015-08-19 ENCOUNTER — Encounter (INDEPENDENT_AMBULATORY_CARE_PROVIDER_SITE_OTHER): Payer: Self-pay

## 2015-08-19 ENCOUNTER — Encounter: Payer: Self-pay | Admitting: Family

## 2015-08-19 VITALS — BP 131/82 | HR 52 | Temp 97.0°F | Ht 73.0 in | Wt 213.0 lb

## 2015-08-19 DIAGNOSIS — Z125 Encounter for screening for malignant neoplasm of prostate: Secondary | ICD-10-CM

## 2015-08-19 DIAGNOSIS — H8113 Benign paroxysmal vertigo, bilateral: Secondary | ICD-10-CM

## 2015-08-19 MED ORDER — MECLIZINE HCL 50 MG PO TABS: 50.0000 mg | ORAL_TABLET | Freq: Three times a day (TID) | ORAL | Status: DC | PRN

## 2015-08-19 NOTE — Patient Instructions (Signed)
Benign Positional Vertigo Vertigo means you feel like you or your surroundings are moving when they are not. Benign positional vertigo is the most common form of vertigo. Benign means that the cause of your condition is not serious. Benign positional vertigo is more common in older adults. CAUSES  Benign positional vertigo is the result of an upset in the labyrinth system. This is an area in the middle ear that helps control your balance. This may be caused by a viral infection, head injury, or repetitive motion. However, often no specific cause is found. SYMPTOMS  Symptoms of benign positional vertigo occur when you move your head or eyes in different directions. Some of the symptoms may include:  Loss of balance and falls.  Vomiting.  Blurred vision.  Dizziness.  Nausea.  Involuntary eye movements (nystagmus). DIAGNOSIS  Benign positional vertigo is usually diagnosed by physical exam. If the specific cause of your benign positional vertigo is unknown, your caregiver may perform imaging tests, such as magnetic resonance imaging (MRI) or computed tomography (CT). TREATMENT  Your caregiver may recommend movements or procedures to correct the benign positional vertigo. Medicines such as meclizine, benzodiazepines, and medicines for nausea may be used to treat your symptoms. In rare cases, if your symptoms are caused by certain conditions that affect the inner ear, you may need surgery. HOME CARE INSTRUCTIONS   Follow your caregiver's instructions.  Move slowly. Do not make sudden body or head movements.  Avoid driving.  Avoid operating heavy machinery.  Avoid performing any tasks that would be dangerous to you or others during a vertigo episode.  Drink enough fluids to keep your urine clear or pale yellow. SEEK IMMEDIATE MEDICAL CARE IF:   You develop problems with walking, weakness, numbness, or using your arms, hands, or legs.  You have difficulty speaking.  You develop  severe headaches.  Your nausea or vomiting continues or gets worse.  You develop visual changes.  Your family or friends notice any behavioral changes.  Your condition gets worse.  You have a fever.  You develop a stiff neck or sensitivity to light. MAKE SURE YOU:   Understand these instructions.  Will watch your condition.  Will get help right away if you are not doing well or get worse. Document Released: 08/26/2006 Document Revised: 02/10/2012 Document Reviewed: 08/08/2011 Exit[employer] Patient Information 2015 [employer]. This information is not intended to replace advice given to you by your health care provider. Make sure you discuss any questions you have with your health care provider.    

## 2015-08-19 NOTE — Progress Notes (Signed)
   Subjective:    Patient ID: Trevor Contreras, male    DOB: 1956-03-19, 59 y.o.   MRN: [id number]  Dizziness This is a recurrent problem. The current episode started more than 1 month ago. The problem occurs intermittently. The problem has been waxing and waning. Associated symptoms include vertigo. Pertinent negatives include no anorexia, chest pain, chills, coughing, fever, nausea, numbness, sore throat or weakness. The symptoms are aggravated by bending. He has tried rest (antivert) for the symptoms. The treatment provided mild relief.      Review of Systems  Constitutional: Negative.  Negative for fever and chills.  HENT: Negative.  Negative for sore throat.   Respiratory: Negative.  Negative for cough.   Cardiovascular: Negative.  Negative for chest pain.  Gastrointestinal: Negative.  Negative for nausea and anorexia.  Endocrine: Negative.   Genitourinary: Negative.   Musculoskeletal: Negative.   Neurological: Positive for dizziness and vertigo. Negative for weakness and numbness.  Hematological: Negative.   Psychiatric/Behavioral: Negative.   All other systems reviewed and are negative.      Objective:   Physical Exam  Constitutional: He is oriented to person, place, and time. He appears well-developed and well-nourished. No distress.  HENT:  Head: Normocephalic.  Eyes: Pupils are equal, round, and reactive to light. Right eye exhibits no discharge. Left eye exhibits no discharge.  Neck: Normal range of motion. Neck supple. No thyromegaly present.  Cardiovascular: Normal rate, regular rhythm, normal heart sounds and intact distal pulses.   No murmur heard. Pulmonary/Chest: Effort normal and breath sounds normal. No respiratory distress. He has no wheezes.  Abdominal: Soft. Bowel sounds are normal. He exhibits no distension. There is no tenderness.  Musculoskeletal: Normal range of motion. He exhibits no edema or tenderness.  Neurological: He is alert and oriented to person,  place, and time. He has normal reflexes. No cranial nerve deficit.  Skin: Skin is warm and dry. No rash noted. No erythema.  Psychiatric: He has a normal mood and affect. His behavior is normal. Judgment and thought content normal.  Vitals reviewed.   BP 131/82 mmHg  Pulse 52  Temp(Src) 97 F (36.1 C) (Oral)  Ht $R'6\' 1"'vq$  (1.854 m)  Wt 213 lb (96.616 kg)  BMI 28.11 kg/m2       Assessment & Plan:  1. BPPV (benign paroxysmal positional vertigo), bilateral -Falls precaution discussed -Avoid bending and fast movements -Different exercises for vertigo discussed and handout given - CBC with Differential/Platelet - Ambulatory referral to ENT - meclizine (ANTIVERT) 50 MG tablet; Take 1 tablet (50 mg total) by mouth 3 (three) times daily as needed.  Dispense: 30 tablet; Refill: 0 - CMP14+EGFR  2. Prostate cancer screening - CMP14+EGFR - PSA, total and free   Evelina Dun, FNP

## 2015-08-21 LAB — PSA, TOTAL AND FREE
PROSTATE SPECIFIC AG, SERUM: 1 ng/mL (ref 0.0–4.0)
PSA FREE: 0.29 ng/mL
PSA, Free Pct: 29 %

## 2015-08-21 LAB — CMP14+EGFR
ALBUMIN: 4.4 g/dL (ref 3.6–4.8)
ALK PHOS: 76 IU/L (ref 39–117)
ALT: 20 IU/L (ref 0–44)
AST: 22 IU/L (ref 0–40)
Albumin/Globulin Ratio: 1.8 (ref 1.1–2.5)
BILIRUBIN TOTAL: 0.5 mg/dL (ref 0.0–1.2)
BUN / CREAT RATIO: 28 — AB (ref 10–22)
BUN: 23 mg/dL (ref 8–27)
CHLORIDE: 100 mmol/L (ref 97–108)
CO2: 27 mmol/L (ref 18–29)
Calcium: 9.6 mg/dL (ref 8.6–10.2)
Creatinine, Ser: 0.83 mg/dL (ref 0.76–1.27)
GFR calc Af Amer: 111 mL/min/{1.73_m2} (ref >59)
GFR calc non Af Amer: 96 mL/min/{1.73_m2} (ref >59)
GLOBULIN, TOTAL: 2.4 g/dL (ref 1.5–4.5)
Glucose: 97 mg/dL (ref 65–99)
Potassium: 5.3 mmol/L — ABNORMAL HIGH (ref 3.5–5.2)
SODIUM: 139 mmol/L (ref 134–144)
Total Protein: 6.8 g/dL (ref 6.0–8.5)

## 2015-08-21 LAB — CBC WITH DIFFERENTIAL/PLATELET
Basophils Absolute: 0.1 10*3/uL (ref 0.0–0.2)
Basos: 2 %
EOS (ABSOLUTE): 0.1 10*3/uL (ref 0.0–0.4)
Eos: 2 %
HEMATOCRIT: 42.8 % (ref 37.5–51.0)
HEMOGLOBIN: 14 g/dL (ref 12.6–17.7)
Immature Grans (Abs): 0 10*3/uL (ref 0.0–0.1)
Immature Granulocytes: 0 %
Lymphocytes Absolute: 2 10*3/uL (ref 0.7–3.1)
Lymphs: 34 %
MCH: 29 pg (ref 26.6–33.0)
MCHC: 32.7 g/dL (ref 31.5–35.7)
MCV: 89 fL (ref 79–97)
MONOCYTES: 10 %
MONOS ABS: 0.5 10*3/uL (ref 0.1–0.9)
NEUTROS ABS: 3 10*3/uL (ref 1.4–7.0)
Neutrophils: 52 %
Platelets: 203 10*3/uL (ref 150–379)
RBC: 4.83 x10E6/uL (ref 4.14–5.80)
RDW: 13.8 % (ref 12.3–15.4)
WBC: 5.7 10*3/uL (ref 3.4–10.8)

## 2015-09-25 ENCOUNTER — Telehealth: Payer: Self-pay | Admitting: Physician Assistant

## 2015-09-26 NOTE — Telephone Encounter (Signed)
Pt will need to be seen to order MRI

## 2015-09-26 NOTE — Telephone Encounter (Signed)
Stp and advised he would ntbs prior to ordering the MRI, pt voiced understanding and will talk with his wife and CB to schedule if that's what they decide.

## 2015-09-26 NOTE — Telephone Encounter (Signed)
Patient has appointment scheduled for December 2 with Dr. Maureen Ralphs at [facility] for right knee pain.  He wants to know if you will go ahead and order an MRI of the right knee so he will have it when he sees him.  He is trying to have everything done before the end of the year.  I asked the patient if he had been seen here for this knee pain and he replied "there is nothing that can be done for me there, I am not a fan of wasting my time or money on things."  Will you order an MRI, he prefers it to be done in [facility].

## 2015-10-03 ENCOUNTER — Ambulatory Visit (INDEPENDENT_AMBULATORY_CARE_PROVIDER_SITE_OTHER): Payer: BC Managed Care – PPO | Admitting: Family

## 2015-10-03 ENCOUNTER — Encounter: Payer: Self-pay | Admitting: Family

## 2015-10-03 VITALS — BP 125/68 | HR 71 | Temp 97.3°F | Ht 73.0 in | Wt 216.2 lb

## 2015-10-03 DIAGNOSIS — G8929 Other chronic pain: Secondary | ICD-10-CM

## 2015-10-03 DIAGNOSIS — M25561 Pain in right knee: Secondary | ICD-10-CM | POA: Diagnosis not present

## 2015-10-03 NOTE — Progress Notes (Signed)
   Subjective:    Patient ID: Trevor Contreras, male    DOB: 20-Sep-1956, 59 y.o.   MRN: [id number]   HPI Pt presents to the office today with chronic right knee pain. Pt has an appt with Orthopedic surgeon on December 2. Pt wants to have a MRI done before that visit. Pt states he believes he will need surgery and would like the surgery done this year, because he has already met his deductible. Pt states he has constant pain of 7 out 10. 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 Pt states he has had a microfracture surgery on his right knee about 4 years ago.    Review of Systems  Constitutional: Negative.   HENT: Negative.   Respiratory: Negative.   Cardiovascular: Negative.   Gastrointestinal: Negative.   Endocrine: Negative.   Genitourinary: Negative.   Musculoskeletal: Negative.   Neurological: Negative.   Hematological: Negative.   Psychiatric/Behavioral: Negative.   All other systems reviewed and are negative.      Objective:   Physical Exam  Constitutional: He is oriented to person, place, and time. He appears well-developed and well-nourished. No distress.  HENT:  Head: Normocephalic.  Eyes: Pupils are equal, round, and reactive to light. Right eye exhibits no discharge. Left eye exhibits no discharge.  Neck: Normal range of motion. Neck supple. No thyromegaly present.  Cardiovascular: Normal rate, regular rhythm, normal heart sounds and intact distal pulses.   No murmur heard. Pulmonary/Chest: Effort normal and breath sounds normal. No respiratory distress. He has no wheezes.  Abdominal: Soft. Bowel sounds are normal. He exhibits no distension. There is no tenderness.  Musculoskeletal: Normal range of motion. He exhibits no edema or tenderness.  Neurological: He is alert and oriented to person, place, and time. He has normal reflexes. No cranial nerve deficit.  Skin: Skin is warm and dry. No  rash noted. No erythema.  Psychiatric: He has a normal mood and affect. His behavior is normal. Judgment and thought content normal.  Vitals reviewed.   BP 125/68 mmHg  Pulse 71  Temp(Src) 97.3 F (36.3 C) (Oral)  Ht $R'6\' 1"'xH$  (1.854 m)  Wt 216 lb 3.2 oz (98.068 kg)  BMI 28.53 kg/m2       Assessment & Plan:  1. Chronic knee pain, right -Keep Ortho appts!! -Continue with stretching and exercising -MRI pending -RTO prn  - MR Knee Right Wo Contrast; Future  Evelina Dun, FNP

## 2015-10-03 NOTE — Patient Instructions (Signed)
Knee Rehabilitation Guidelines Following Surgery After knee surgery, it is important to follow instructions from your health care provider about range-of-motion (ROM) and muscle strengthening exercises. This will improve your surgery results. If the exercises cause you to have pain or swelling in your knee joint, do them less often until you can do them without pain. Then, slowly increase how often you do your exercises. If you have problems or questions, talk with your health care provider or physical therapist. You should start exercising as soon as your health care provider or physical therapist says it is okay. HOME CARE INSTRUCTIONS Activity  Use your crutches orwalker as told by your health care provider.  Do not lift anything that is heavier than 10 lb (4.5 kg) and do not play contact sports until your health care provider says it is okay.  Return to your normal activities as told by your health care provider. Ask your health care provider what activities are safe for you.  Return to work as told by your health care provider.  Do not drive a car for six weeks or as told by your health care provider. General Instructions  Take over-the-counter and prescription medicines only as told by your health care provider.  Protect your knee during the recovery period to keep it from getting injured again.  You may take sponge baths. Do not take showers or tub baths until your health care provider says it is okay.  Remove throw rugs and tripping hazards from the floor.  Wear elastic stockings for as long as your health care provider instructs you to.  Keep all follow-up visits as told by your health care provider. This is important. RANGE-OF-MOTION AND STRENGTHENING EXERCISES Do your exercises as told by your health care provider or physical therapist. Before You Exercise  Put a towel between your thigh and a heat pack or heating pad.  Leave the heat on your thigh muscle for 20-30  minutes before you exercise. Leg Lifts While your knee is still in a splint or a cast, you can do straight-leg raises. Repeat this exercise 10-20 times, 2-3 times per day. As your knee gets better, do this exercise against resistance. 1. Lie flat on your back. 2. Lift the leg about 6 inches. Keep it raised for 3 seconds. 3. Slowly lower the leg. Quad Sets Repeat this exercise 10-20 times every hour. 1. Lie flat on your back. 2. Tighten your thigh muscle (quad). 3. Keep the muscle tight for 5-10 seconds. Hamstring Sets Repeat this exercise 10-20 times every hour. 1. Push your foot backward against an object that does not move. 2. Keep pushing your foot against it for 5-10 seconds. Weight-Resistance Exercises Weight-resistance exercises are another important part of rehabilitation. These exercises strengthen your muscles by making them work against resistance. Examples include using:  Free weights.  Weight-lifting machines.  Resistance bands. Aerobic Exercises Aerobic exercise keeps joints and muscles moving. It involves large muscle groups. It is also rhythmic in nature and is done for a longer period. Doing these exercises improves circulation and endurance. Your health care provider may have you start by taking a 20-30 minute walk, 2 times per day. Examples of aerobic exercise include:  Swimming.  Walking.  Hiking.  Jogging.  Cross-country skiing.  Bike riding.   This information is not intended to replace advice given to you by your health care provider. Make sure you discuss any questions you have with your health care provider.   Document Released: 11/18/2005 Document Revised:  04/04/2015 Document Reviewed: 11/14/2014 Elsevier Interactive Patient Education [employer].

## 2015-10-09 ENCOUNTER — Other Ambulatory Visit: Payer: BC Managed Care – PPO

## 2015-10-10 ENCOUNTER — Telehealth: Payer: Self-pay | Admitting: Family

## 2015-10-13 NOTE — Telephone Encounter (Signed)
Spoke with pt and let him know that he was responsible for getting MRI images to Dr. Wynelle Link. We only have report ; Pt verbalized understanding and said he would get them sent to [facility]

## 2015-11-16 ENCOUNTER — Telehealth: Payer: Self-pay | Admitting: Physician Assistant

## 2015-11-16 NOTE — Telephone Encounter (Signed)
Stp and reviewed PSA result from 08/19/15.

## 2016-05-03 ENCOUNTER — Encounter: Payer: Self-pay | Admitting: Gastroenterology

## 2016-10-02 ENCOUNTER — Ambulatory Visit (INDEPENDENT_AMBULATORY_CARE_PROVIDER_SITE_OTHER): Payer: BC Managed Care – PPO | Admitting: Pediatrics

## 2016-10-02 ENCOUNTER — Encounter: Payer: Self-pay | Admitting: Pediatrics

## 2016-10-02 VITALS — BP 114/69 | HR 77 | Temp 97.9°F | Ht 73.0 in | Wt 211.0 lb

## 2016-10-02 DIAGNOSIS — B9789 Other viral agents as the cause of diseases classified elsewhere: Secondary | ICD-10-CM | POA: Diagnosis not present

## 2016-10-02 DIAGNOSIS — Z125 Encounter for screening for malignant neoplasm of prostate: Secondary | ICD-10-CM | POA: Diagnosis not present

## 2016-10-02 DIAGNOSIS — J069 Acute upper respiratory infection, unspecified: Secondary | ICD-10-CM

## 2016-10-02 MED ORDER — GUAIFENESIN-CODEINE 100-10 MG/5ML PO SOLN: 5.0000 mL | Freq: Three times a day (TID) | ORAL | 0 refills | Status: DC | PRN

## 2016-10-02 NOTE — Progress Notes (Signed)
  Subjective:   Patient ID: Trevor Contreras, male    DOB: 10-11-1956, 60 y.o.   MRN: [id number] CC: Cough (pt here today for cough and congestion for the past few days and it is much worse at night when he lays down)  HPI: Trevor Contreras is a 60 y.o. male presenting for Cough (pt here today for cough and congestion for the past few days and it is much worse at night when he lays down)  Started three days ago Taking nyquil Some congestion Aspirin helping with aches and pains Temp of 100.4 today, taking tylenol, also helps Not able to sleep at all due to coughing Son with similar symptoms recently  [employer] wants him to have yearly PSA No h/o prostate ca or elevated PSA No fam hx of prostate ca  Relevant past medical, surgical, family and social history reviewed. Allergies and medications reviewed and updated. History  Smoking Status  . Never Smoker  Smokeless Tobacco  . Never Used   ROS: Per HPI   Objective:    BP 114/69   Pulse 77   Temp 97.9 F (36.6 C) (Oral)   Ht 6\' 1"  (1.854 m)   Wt 211 lb (95.7 kg)   BMI 27.84 kg/m   Wt Readings from Last 3 Encounters:  10/02/16 211 lb (95.7 kg)  10/03/15 216 lb 3.2 oz (98.1 kg)  08/19/15 213 lb (96.6 kg)    Gen: NAD, alert, cooperative with exam, NCAT EYES: EOMI, no conjunctival injection, or no icterus ENT:  TMs pearly gray b/l, OP without erythema LYMPH: no cervical LAD CV: NRRR, normal S1/S2, no murmur, distal pulses 2+ b/l Resp: CTABL, no wheezes or crackles, normal WOB Ext: No edema, warm Neuro: Alert and oriented MSK: normal muscle bulk  Assessment & Plan:  Trevor Contreras was seen today for cough.  Diagnoses and all orders for this visit:  Viral URI with cough Ongoing symptoms 3 days Discussed symptomatic care -     guaiFENesin-codeine 100-10 MG/5ML syrup; Take 5-10 mLs by mouth 3 (three) times daily as needed for cough.  Prostate cancer screening Discussed risk/benefit of testing PSA to screen for prostate ca Ipt  wants to proceed -     PSA, total and free  Follow up plan: Return if symptoms worsen or fail to improve. Assunta Found, MD [place]

## 2016-10-03 LAB — PSA, TOTAL AND FREE
PROSTATE SPECIFIC AG, SERUM: 1.2 ng/mL (ref 0.0–4.0)
PSA, Free Pct: 17.5 %
PSA, Free: 0.21 ng/mL

## 2016-10-14 ENCOUNTER — Encounter: Payer: Self-pay | Admitting: Pediatrics

## 2016-10-14 ENCOUNTER — Ambulatory Visit (INDEPENDENT_AMBULATORY_CARE_PROVIDER_SITE_OTHER): Payer: BC Managed Care – PPO

## 2016-10-14 ENCOUNTER — Ambulatory Visit (INDEPENDENT_AMBULATORY_CARE_PROVIDER_SITE_OTHER): Payer: BC Managed Care – PPO | Admitting: Pediatrics

## 2016-10-14 VITALS — BP 117/66 | HR 64 | Temp 98.0°F | Resp 20 | Ht 73.0 in | Wt 207.0 lb

## 2016-10-14 DIAGNOSIS — R202 Paresthesia of skin: Secondary | ICD-10-CM

## 2016-10-14 DIAGNOSIS — R05 Cough: Secondary | ICD-10-CM

## 2016-10-14 DIAGNOSIS — R059 Cough, unspecified: Secondary | ICD-10-CM

## 2016-10-14 MED ORDER — HYDROXYZINE HCL 25 MG PO TABS: 25.0000 mg | ORAL_TABLET | Freq: Three times a day (TID) | ORAL | 0 refills | Status: DC | PRN

## 2016-10-14 MED ORDER — AZITHROMYCIN 250 MG PO TABS: ORAL_TABLET | ORAL | 0 refills | Status: DC

## 2016-10-14 NOTE — Progress Notes (Signed)
  Subjective:   Patient ID: Trevor Contreras, male    DOB: 1956/03/21, 60 y.o.   MRN: [id number] CC: Cough and Chest congestion  HPI: Trevor Contreras is a 60 y.o. male presenting for Cough and Chest congestion  Still coughing Has been productive still Thinks slightly better No fevers Cough present day and night No improvement with the cough medication so he stopped taking it Has a creepy crawly feeling over his neck onto his scalp No where else on body Notices it mostly at night Not really itching he says No rash on body Has not noticed any bugs Never had this feeling before No one else at home with it No new medications other than cough medication, not taking Has tried permethrin, ivermectin No improvement Sunscreen sprayed on skin at night helps  Denies drug use, EtOH use  Relevant past medical, surgical, family and social history reviewed. Allergies and medications reviewed and updated. History  Smoking Status  . Never Smoker  Smokeless Tobacco  . Never Used   ROS: Per HPI   Objective:    BP 117/66   Pulse 64   Temp 98 F (36.7 C) (Oral)   Resp 20   Ht _0  (1.854 m)   Wt 207 lb (93.9 kg)   SpO2 100%   BMI 27.31 kg/m   Wt Readings from Last 3 Encounters:  10/14/16 207 lb (93.9 kg)  10/02/16 211 lb (95.7 kg)  10/03/15 216 lb 3.2 oz (98.1 kg)     Gen: NAD, alert, cooperative with exam, NCAT EYES: EOMI, no conjunctival injection, or no icterus ENT:  TMs pearly gray b/l, OP without erythema LYMPH: no cervical LAD CV: NRRR, normal S1/S2, no murmur, distal pulses 2+ b/l Resp: CTABL, no wheezes, normal WOB Ext: No edema, warm Neuro: Alert and oriented, normal sensation over face Skin: no rash over face/scalp  Assessment & Plan:  Trevor Contreras was seen today for cough and chest congestion.  Diagnoses and all orders for this visit:  Cough -     DG Chest 2 View; Future -     azithromycin (ZITHROMAX) 250 MG tablet; Take 2 the first day and then one each day  after.  Sensation of skin crawling -     CMP14+EGFR -     CBC with Differential/Platelet -     Thyroid Panel With TSH -     Vitamin B12 -     hydrOXYzine (ATARAX/VISTARIL) 25 MG tablet; Take 1 tablet (25 mg total) by mouth 3 (three) times daily as needed.   Follow up plan: Return if symptoms worsen or fail to improve. Assunta Found, MD [place]

## 2016-10-15 LAB — THYROID PANEL WITH TSH
FREE THYROXINE INDEX: 1.8 (ref 1.2–4.9)
T3 UPTAKE RATIO: 28 % (ref 24–39)
T4, Total: 6.3 ug/dL (ref 4.5–12.0)
TSH: 2.25 u[IU]/mL (ref 0.450–4.500)

## 2016-10-15 LAB — CMP14+EGFR
A/G RATIO: 1.5 (ref 1.2–2.2)
ALT: 24 IU/L (ref 0–44)
AST: 21 IU/L (ref 0–40)
Albumin: 4.1 g/dL (ref 3.6–4.8)
Alkaline Phosphatase: 99 IU/L (ref 39–117)
BILIRUBIN TOTAL: 0.4 mg/dL (ref 0.0–1.2)
BUN/Creatinine Ratio: 12 (ref 10–24)
BUN: 13 mg/dL (ref 8–27)
CALCIUM: 9.5 mg/dL (ref 8.6–10.2)
CHLORIDE: 98 mmol/L (ref 96–106)
CO2: 22 mmol/L (ref 18–29)
Creatinine, Ser: 1.07 mg/dL (ref 0.76–1.27)
GFR calc Af Amer: 86 mL/min/{1.73_m2} (ref >59)
GFR calc non Af Amer: 75 mL/min/{1.73_m2} (ref >59)
Globulin, Total: 2.8 g/dL (ref 1.5–4.5)
Glucose: 100 mg/dL — ABNORMAL HIGH (ref 65–99)
POTASSIUM: 4.3 mmol/L (ref 3.5–5.2)
Sodium: 140 mmol/L (ref 134–144)
Total Protein: 6.9 g/dL (ref 6.0–8.5)

## 2016-10-15 LAB — CBC WITH DIFFERENTIAL/PLATELET
BASOS ABS: 0.1 10*3/uL (ref 0.0–0.2)
Basos: 1 %
EOS (ABSOLUTE): 0.1 10*3/uL (ref 0.0–0.4)
Eos: 1 %
Hematocrit: 39.5 % (ref 37.5–51.0)
Hemoglobin: 13.5 g/dL (ref 12.6–17.7)
IMMATURE GRANS (ABS): 0 10*3/uL (ref 0.0–0.1)
IMMATURE GRANULOCYTES: 0 %
LYMPHS: 31 %
Lymphocytes Absolute: 1.8 10*3/uL (ref 0.7–3.1)
MCH: 28.8 pg (ref 26.6–33.0)
MCHC: 34.2 g/dL (ref 31.5–35.7)
MCV: 84 fL (ref 79–97)
Monocytes Absolute: 0.4 10*3/uL (ref 0.1–0.9)
Monocytes: 8 %
NEUTROS PCT: 59 %
Neutrophils Absolute: 3.4 10*3/uL (ref 1.4–7.0)
PLATELETS: 338 10*3/uL (ref 150–379)
RBC: 4.68 x10E6/uL (ref 4.14–5.80)
RDW: 14.2 % (ref 12.3–15.4)
WBC: 5.7 10*3/uL (ref 3.4–10.8)

## 2016-10-15 LAB — VITAMIN B12: Vitamin B-12: 808 pg/mL (ref 211–946)

## 2016-11-19 ENCOUNTER — Telehealth: Payer: Self-pay | Admitting: Gastroenterology

## 2016-11-19 ENCOUNTER — Encounter: Payer: Self-pay | Admitting: *Deleted

## 2016-11-19 NOTE — Telephone Encounter (Signed)
No answer/machine.  I will try and call back later today to set up colonoscopy

## 2016-11-19 NOTE — Telephone Encounter (Signed)
Left message for patient to call back  

## 2016-11-19 NOTE — Telephone Encounter (Signed)
RKs note say recall colonoscopy in 5 years which is reasonable. He should have had a colonoscopy in 01/2015. He is overdue and ok for direct colonoscopy.

## 2016-11-19 NOTE — Telephone Encounter (Signed)
Patient notified of recommendations He would like to do prior to the end of the year.  He is scheduled for colon pre-visit tomorrow and colon on 11/27/16 with Dr. Carlean Purl

## 2016-11-19 NOTE — Telephone Encounter (Addendum)
Dr./ Fuller Plan will you please review and advise.  Last colonoscopy was in 2011 and it was normal as was in 2006.  Dr. Deatra Ina recommended a recall in 2022. History of colon cancer in 2002.  Please advise if recall is correct

## 2016-11-20 ENCOUNTER — Ambulatory Visit (AMBULATORY_SURGERY_CENTER): Payer: Self-pay

## 2016-11-20 DIAGNOSIS — C801 Malignant (primary) neoplasm, unspecified: Secondary | ICD-10-CM

## 2016-11-20 NOTE — Progress Notes (Signed)
HAS NEEDLE PHOBIA! No allergies to eggs or soy No past problems with anesthesia No home oxygen No diet meds  Declined emmi

## 2016-11-27 ENCOUNTER — Ambulatory Visit (AMBULATORY_SURGERY_CENTER): Payer: BC Managed Care – PPO | Admitting: Internal Medicine

## 2016-11-27 ENCOUNTER — Encounter: Payer: Self-pay | Admitting: Internal Medicine

## 2016-11-27 VITALS — BP 96/63 | HR 58 | Temp 97.1°F | Resp 14 | Ht 73.5 in | Wt 212.0 lb

## 2016-11-27 DIAGNOSIS — Z85038 Personal history of other malignant neoplasm of large intestine: Secondary | ICD-10-CM

## 2016-11-27 MED ORDER — SODIUM CHLORIDE 0.9 % IV SOLN: 500.0000 mL | INTRAVENOUS | Status: DC

## 2016-11-27 NOTE — Progress Notes (Signed)
To PACU Pt awake and alert. Report to RN 

## 2016-11-27 NOTE — Progress Notes (Signed)
No problems noted in the recovery room. maw 

## 2016-11-27 NOTE — Op Note (Signed)
[facility] Patient Name: Trevor Contreras Procedure Date: 11/27/2016 10:07 AM MRN: [id number] Endoscopist: Gatha Mayer , MD Age: 60 Referring MD:  Date of Birth: [birth year] Gender: Male Account #: [id number] Procedure:                Colonoscopy Indications:              High risk colon cancer surveillance: Personal                            history of colon cancer 2002 Medicines:                Propofol per Anesthesia, Monitored Anesthesia Care Procedure:                Pre-Anesthesia Assessment:                           - Prior to the procedure, a History and Physical                            was performed, and patient medications and                            allergies were reviewed. The patient's tolerance of                            previous anesthesia was also reviewed. The risks                            and benefits of the procedure and the sedation                            options and risks were discussed with the patient.                            All questions were answered, and informed consent                            was obtained. Prior Anticoagulants: The patient has                            taken no previous anticoagulant or antiplatelet                            agents. ASA Grade Assessment: II - A patient with                            mild systemic disease. After reviewing the risks                            and benefits, the patient was deemed in                            satisfactory condition to undergo the procedure.  After obtaining informed consent, the colonoscope                            was passed under direct vision. Throughout the                            procedure, the patient's blood pressure, pulse, and                            oxygen saturations were monitored continuously. The                            Model CF-HQ190L [id number]) scope was introduced                            through the  anus and advanced to the the                            ileocolonic anastomosis. The colonoscopy was                            performed without difficulty. The patient tolerated                            the procedure well. The quality of the bowel                            preparation was good. The bowel preparation used                            was Miralax. The rectum and Ileocolonic anastomsis                            areas were photographed. Scope In: 10:22:34 AM Scope Out: 10:36:33 AM Scope Withdrawal Time: 0 hours 10 minutes 42 seconds  Total Procedure Duration: 0 hours 13 minutes 59 seconds  Findings:                 The perianal and digital rectal examinations were                            normal. Pertinent negatives include normal prostate                            (size, shape, and consistency).                           There was evidence of a prior end-to-side                            ileo-colonic anastomosis in the transverse colon.                            This was patent and was characterized by healthy  appearing mucosa.                           A few large-mouthed diverticula were found in the                            sigmoid colon. There was no evidence of                            diverticular bleeding.                           The exam was otherwise without abnormality on                            direct and retroflexion views. Complications:            No immediate complications. Estimated Blood Loss:     Estimated blood loss: none. Impression:               - Patent end-to-side ileo-colonic anastomosis,                            characterized by healthy appearing mucosa.                           - Mild diverticulosis in the sigmoid colon. There                            was no evidence of diverticular bleeding.                           - The examination was otherwise normal on direct                            and  retroflexion views.                           - No specimens collected. Recommendation:           - Patient has a contact number available for                            emergencies. The signs and symptoms of potential                            delayed complications were discussed with the                            patient. Return to normal activities tomorrow.                            Written discharge instructions were provided to the                            patient.                           -  Resume previous diet.                           - Continue present medications.                           - Repeat colonoscopy in 5 years for surveillance. Gatha Mayer, MD 11/27/2016 10:48:32 AM This report has been signed electronically.

## 2016-11-27 NOTE — Patient Instructions (Addendum)
No polyps or cancer seen.  Your next routine colonoscopy should be in 5 years - 2022-3.  I appreciate the opportunity to care for you. Gatha Mayer, MD, FACG     YOU HAD AN ENDOSCOPIC PROCEDURE TODAY AT [facility]:   Refer to the procedure report that was given to you for any specific questions about what was found during the examination.  If the procedure report does not answer your questions, please call your gastroenterologist to clarify.  If you requested that your care partner not be given the details of your procedure findings, then the procedure report has been included in a sealed envelope for you to review at your convenience later.  YOU SHOULD EXPECT: Some feelings of bloating in the abdomen. Passage of more gas than usual.  Walking can help get rid of the air that was put into your GI tract during the procedure and reduce the bloating. If you had a lower endoscopy (such as a colonoscopy or flexible sigmoidoscopy) you may notice spotting of blood in your stool or on the toilet paper. If you underwent a bowel prep for your procedure, you may not have a normal bowel movement for a few days.  Please Note:  You might notice some irritation and congestion in your nose or some drainage.  This is from the oxygen used during your procedure.  There is no need for concern and it should clear up in a day or so.  SYMPTOMS TO REPORT IMMEDIATELY:   Following lower endoscopy (colonoscopy or flexible sigmoidoscopy):  Excessive amounts of blood in the stool  Significant tenderness or worsening of abdominal pains  Swelling of the abdomen that is new, acute  Fever of 100F or higher   Following upper endoscopy (EGD)  Vomiting of blood or coffee ground material  New chest pain or pain under the shoulder blades  Painful or persistently difficult swallowing  New shortness of breath  Fever of 100F or higher  Black, tarry-looking stools  For urgent or emergent  issues, a gastroenterologist can be reached at any hour by calling [phone].   DIET:  We do recommend a small meal at first, but then you may proceed to your regular diet.  Drink plenty of fluids but you should avoid alcoholic beverages for 24 hours.  ACTIVITY:  You should plan to take it easy for the rest of today and you should NOT DRIVE or use heavy machinery until tomorrow (because of the sedation medicines used during the test).    FOLLOW UP: Our staff will call the number listed on your records the next business day following your procedure to check on you and address any questions or concerns that you may have regarding the information given to you following your procedure. If we do not reach you, we will leave a message.  However, if you are feeling well and you are not experiencing any problems, there is no need to return our call.  We will assume that you have returned to your regular daily activities without incident.  If any biopsies were taken you will be contacted by phone or by letter within the next 1-3 weeks.  Please call us at [phone] if you have not heard about the biopsies in 3 weeks.    SIGNATURES/CONFIDENTIALITY: You and/or your care partner have signed paperwork which will be entered into your electronic medical record.  These signatures attest to the fact that that the information above on your  After Visit Summary has been reviewed and is understood.  Full responsibility of the confidentiality of this discharge information lies with you and/or your care-partner.    Handout was given to your care diverticulosis. You may resume your current medications today. Repeat colonoscopy in 5 years for surveillance. Please call if any questions or concerns.

## 2016-11-28 ENCOUNTER — Telehealth: Payer: Self-pay | Admitting: *Deleted

## 2016-11-28 NOTE — Telephone Encounter (Signed)
  Follow up Call-  Call back number 11/27/2016  Post procedure Call Back phone  # [phone]  Permission to leave phone message Yes  Some recent data might be hidden     Patient questions:  Do you have a fever, pain , or abdominal swelling? No. Pain Score  0 *  Have you tolerated food without any problems? Yes.    Have you been able to return to your normal activities? Yes.    Do you have any questions about your discharge instructions: Diet   No. Medications  No. Follow up visit  No.  Do you have questions or concerns about your Care? No.  Actions: * If pain score is 4 or above: No action needed, pain <4.

## 2017-09-13 IMAGING — DX DG CHEST 2V
2 series · 2 of 2 positions shown · non-contrast
Comparison: None.

CLINICAL DATA: Cough for 10 days

EXAM:
CHEST  2 VIEW

[chest pa]
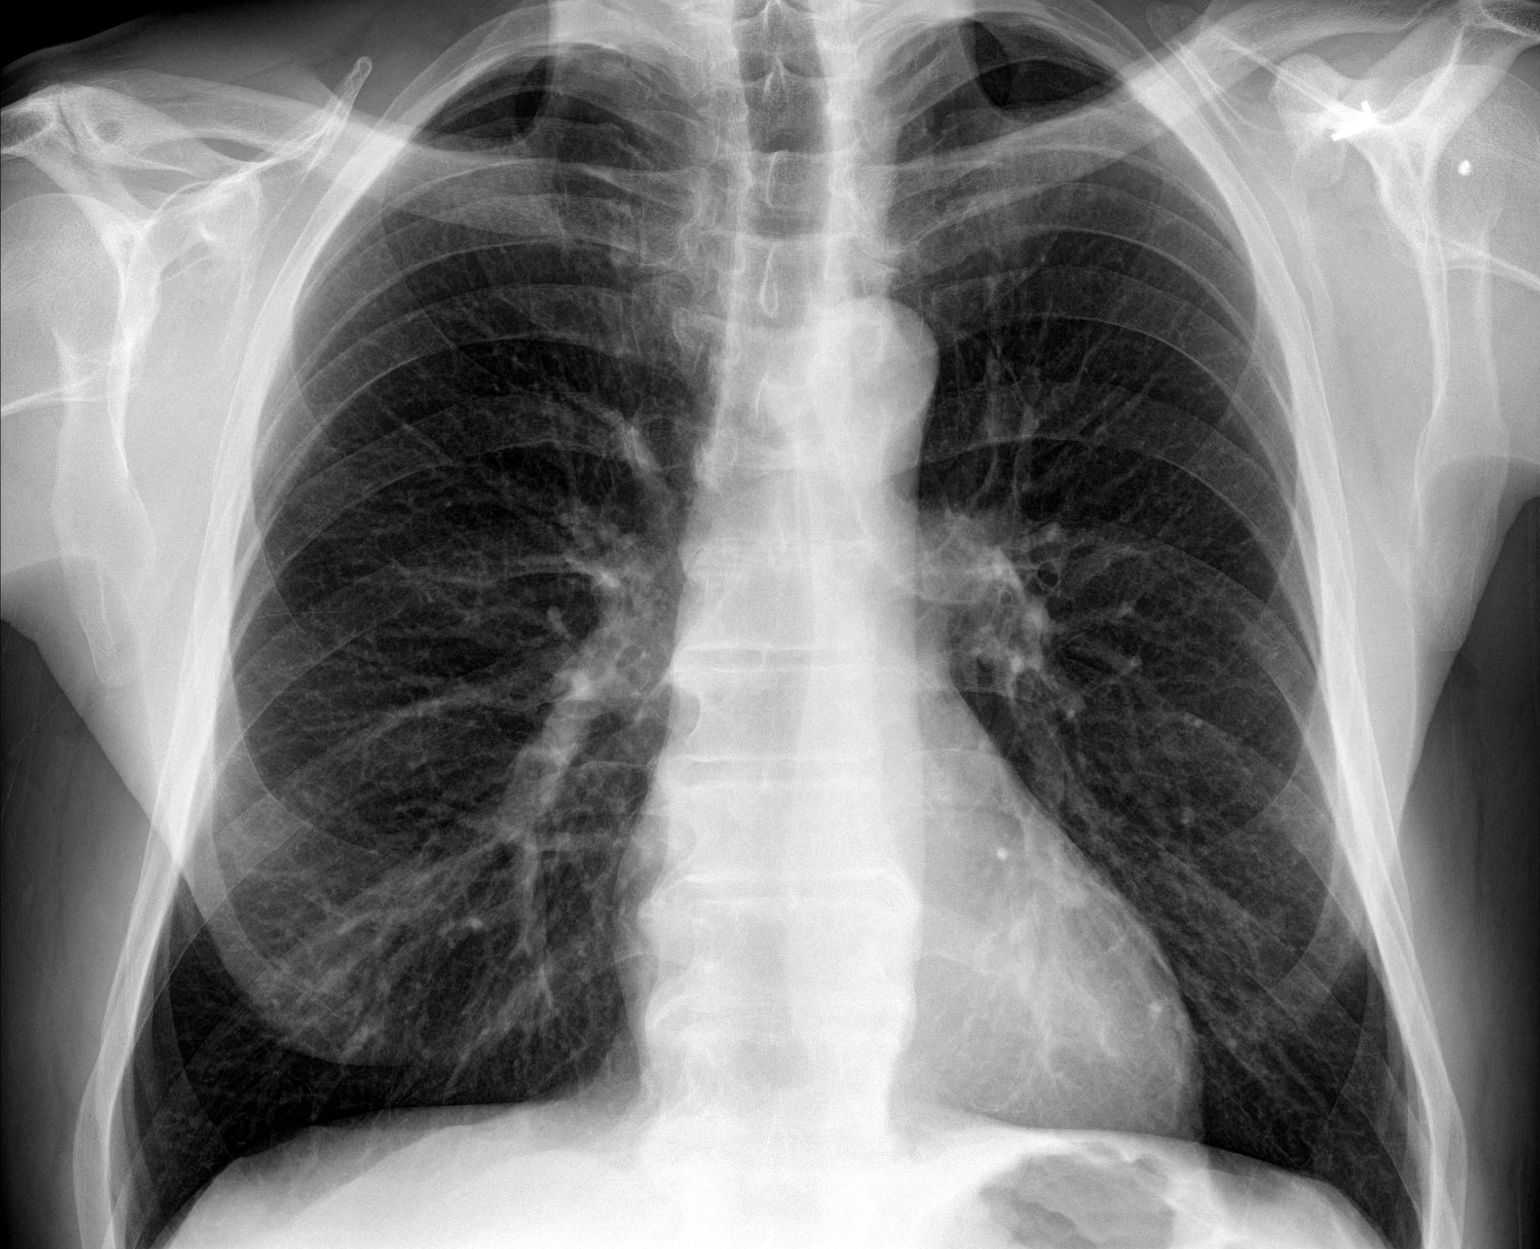

[chest lat]
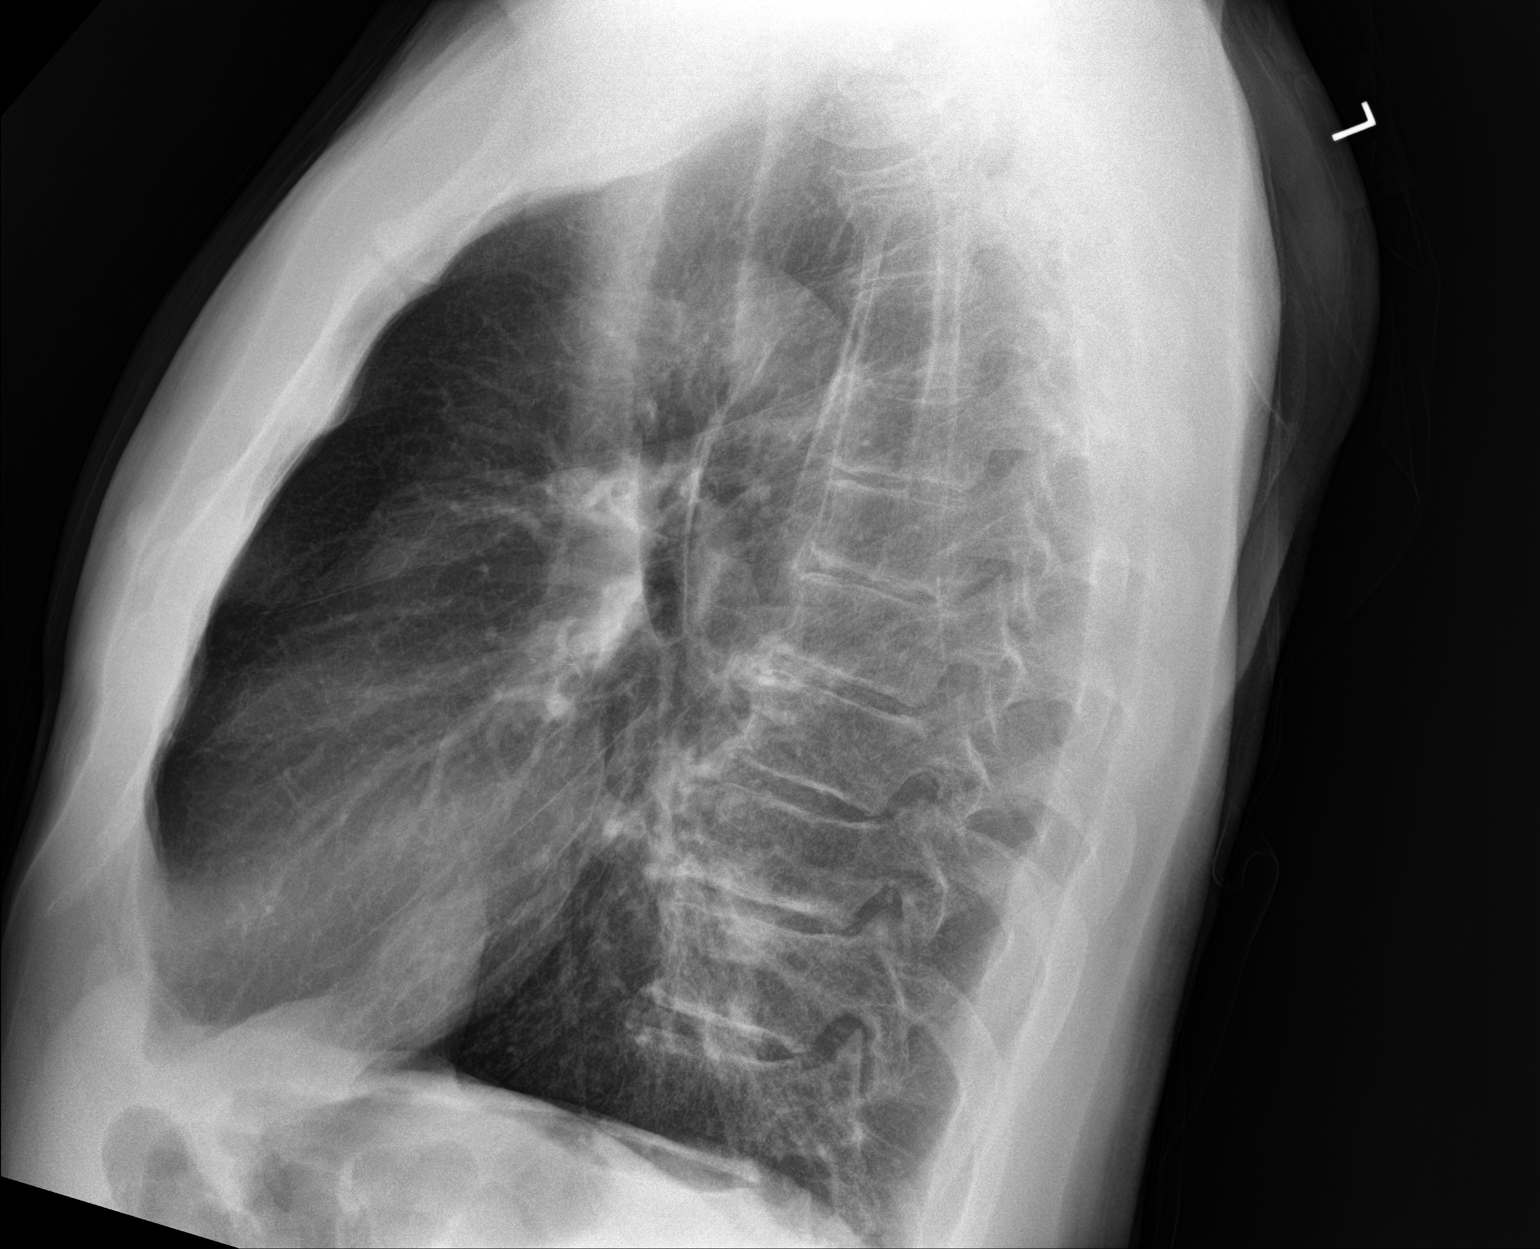

[2 of 2 positions shown; findings below may reference images not displayed]

FINDINGS: Normal heart size. Lungs are hyperaerated and clear. No
pneumothorax. No pleural effusion.
IMPRESSION: No active cardiopulmonary disease.

## 2017-11-28 ENCOUNTER — Ambulatory Visit (INDEPENDENT_AMBULATORY_CARE_PROVIDER_SITE_OTHER): Payer: BC Managed Care – PPO

## 2017-11-28 ENCOUNTER — Ambulatory Visit: Payer: BC Managed Care – PPO | Admitting: Physician Assistant

## 2017-11-28 ENCOUNTER — Encounter: Payer: Self-pay | Admitting: Physician Assistant

## 2017-11-28 VITALS — BP 124/77 | HR 84 | Temp 98.2°F | Ht 73.5 in | Wt 219.0 lb

## 2017-11-28 DIAGNOSIS — L309 Dermatitis, unspecified: Secondary | ICD-10-CM

## 2017-11-28 DIAGNOSIS — Z0000 Encounter for general adult medical examination without abnormal findings: Secondary | ICD-10-CM

## 2017-11-28 DIAGNOSIS — S63622S Sprain of interphalangeal joint of left thumb, sequela: Secondary | ICD-10-CM

## 2017-11-28 DIAGNOSIS — S63622A Sprain of interphalangeal joint of left thumb, initial encounter: Secondary | ICD-10-CM | POA: Diagnosis not present

## 2017-11-28 MED ORDER — PREDNISONE 10 MG (21) PO TBPK: ORAL_TABLET | ORAL | 0 refills | Status: DC

## 2017-11-28 NOTE — Patient Instructions (Signed)
In a few days you may receive a survey in the mail or online from [employer] regarding your visit with us today. Please take a moment to fill this out. Your feedback is very important to our whole office. It can help us better understand your needs as well as improve your experience and satisfaction. Thank you for taking your time to complete it. We care about you.  Evrett Hakim, PA-C  

## 2017-11-29 LAB — PSA: PROSTATE SPECIFIC AG, SERUM: 1.5 ng/mL (ref 0.0–4.0)

## 2017-12-01 NOTE — Progress Notes (Signed)
BP 124/77   Pulse 84   Temp 98.2 F (36.8 C) (Oral)   Ht 6' 1.5" (1.867 m)   Wt 219 lb (99.3 kg)   BMI 28.50 kg/m    Subjective:    Patient ID: Trevor Contreras, male    DOB: 03/31/1956, 61 y.o.   MRN: [id number]  HPI: Trevor Contreras is a 61 y.o. male presenting on 11/28/2017 for Rash (back of neck and back- Has seen 3 dermatologist and nothing has helped )  He has a rash for going on two years. It is red and itching, severe at times. The most prevalent is on the upper back, worse on the left. It does go up his neck to the beginning of the scalp. He does work out a lot and throws his towel over the left. He sleeps on the left also. He was tried on elocon, triamcinolone and oral doxepin, which made him extremely sleepy.  Nothing calmed it down.   He has never had a biopsy of the rash, though he had visited dermatologists.  We discussed a referral to [facility] as someone who will investigate it well.  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   Relevant past medical, surgical, family and social history reviewed and updated as indicated. Allergies and medications reviewed and updated.  Past Medical History:  Diagnosis Date  . Colon cancer ([place]) 2002    Past Surgical History:  Procedure Laterality Date  . COLON SURGERY    . COLONOSCOPY    . EYE SURGERY     trying to correct blurred vision in left eye; unsuccessful  . KNEE ARTHROSCOPY     bilat; several surgeries  . SHOULDER ARTHROSCOPY     rotator cuff, reattach part of long bicep, Dr Ivin Booty    Review of Systems  Constitutional: Negative.  Negative for appetite change and fatigue.  HENT: Negative.   Eyes: Negative.  Negative for pain and visual disturbance.  Respiratory: Negative.  Negative for cough, chest tightness, shortness of breath and wheezing.   Cardiovascular: Negative.  Negative for chest pain, palpitations and leg swelling.  Gastrointestinal: Negative.   Negative for abdominal pain, diarrhea, nausea and vomiting.  Endocrine: Negative.   Genitourinary: Negative.   Musculoskeletal: Positive for arthralgias and back pain.  Skin: Positive for color change, rash and wound.  Neurological: Negative.  Negative for weakness, numbness and headaches.  Psychiatric/Behavioral: Negative.     Allergies as of 11/28/2017   No Known Allergies     Medication List        Accurate as of 11/28/17 11:59 PM. Always use your most recent med list.          mometasone 0.1 % cream Commonly known as:  ELOCON APPLY TO AFFECTED AREA TWICE A DAY   predniSONE 10 MG (21) Tbpk tablet Commonly known as:  STERAPRED UNI-PAK 21 TAB As directed x 6 days   triamcinolone cream 0.1 % Commonly known as:  KENALOG APPLY ON THE SKIN TWICE A DAY AS NEEDED          Objective:    BP 124/77   Pulse 84   Temp 98.2 F (36.8 C) (Oral)   Ht 6' 1.5" (1.867 m)   Wt 219 lb (99.3 kg)   BMI 28.50 kg/m   No Known Allergies  Physical Exam  Constitutional: He appears well-developed and well-nourished. No distress.  HENT:  Head: Normocephalic and  atraumatic.  Eyes: Conjunctivae and EOM are normal. Pupils are equal, round, and reactive to light.  Cardiovascular: Normal rate, regular rhythm and normal heart sounds.  Pulmonary/Chest: Effort normal and breath sounds normal. No respiratory distress.  Musculoskeletal:       Left hand: He exhibits decreased range of motion and tenderness.       Hands: Skin: Skin is warm and dry. Rash noted. Rash is macular and papular. There is erythema.     Psychiatric: He has a normal mood and affect. His behavior is normal.  Nursing note and vitals reviewed.   Results for orders placed or performed in visit on 11/28/17  PSA  Result Value Ref Range   Prostate Specific Ag, Serum 1.5 0.0 - 4.0 ng/mL      Assessment & Plan:   1. Eczema, unspecified type CHRONIC, NON-HEALING - Ambulatory referral to Dermatology  2. Sprain of  interphalangeal joint of left thumb, sequela - DG Hand Complete Left; Future Normal findings  3. Well adult exam - PSA    Current Outpatient Medications:  .  mometasone (ELOCON) 0.1 % cream, APPLY TO AFFECTED AREA TWICE A DAY, Disp: , Rfl: 2 .  triamcinolone cream (KENALOG) 0.1 %, APPLY ON THE SKIN TWICE A DAY AS NEEDED, Disp: , Rfl: 2 .  predniSONE (STERAPRED UNI-PAK 21 TAB) 10 MG (21) TBPK tablet, As directed x 6 days, Disp: 21 tablet, Rfl: 0 Continue all other maintenance medications as listed above.  Follow up plan: Return if symptoms worsen or fail to improve.  Educational handout given for [place] PA-C [facility] [address]  [place], [place] [address] [phone]   12/01/2017, 1:41 PM

## 2018-03-12 ENCOUNTER — Encounter: Payer: Self-pay | Admitting: Family

## 2018-03-12 ENCOUNTER — Ambulatory Visit: Payer: BC Managed Care – PPO | Admitting: Family

## 2018-03-12 VITALS — BP 124/78 | HR 53 | Temp 97.2°F | Ht 73.0 in | Wt 215.0 lb

## 2018-03-12 DIAGNOSIS — H60501 Unspecified acute noninfective otitis externa, right ear: Secondary | ICD-10-CM | POA: Diagnosis not present

## 2018-03-12 MED ORDER — HYDROCORTISONE-ACETIC ACID 1-2 % OT SOLN: 4.0000 [drp] | Freq: Three times a day (TID) | OTIC | 0 refills | Status: DC

## 2018-03-12 NOTE — Progress Notes (Signed)
   Subjective:    Patient ID: Trevor Contreras, male    DOB: 05-18-1956, 62 y.o.   MRN: [id number]  Otalgia   There is pain in the right ear. This is a new problem. The current episode started 1 to 4 weeks ago. The problem occurs constantly. The problem has been unchanged. There has been no fever. The pain is at a severity of 4/10. The pain is mild. Pertinent negatives include no coughing, ear discharge, headaches, hearing loss or sore throat. Treatments tried: peroxide. The treatment provided mild relief.      Review of Systems  HENT: Positive for ear pain. Negative for ear discharge, hearing loss and sore throat.   Respiratory: Negative for cough.   Neurological: Negative for headaches.  All other systems reviewed and are negative.      Objective:   Physical Exam  Constitutional: He is oriented to person, place, and time. He appears well-developed and well-nourished. No distress.  HENT:  Head: Normocephalic.  Right Ear: There is swelling and tenderness (dry, flaky ).  Left Ear: External ear normal.  Mouth/Throat: Oropharynx is clear and moist.  Eyes: Pupils are equal, round, and reactive to light. Right eye exhibits no discharge. Left eye exhibits no discharge.  Neck: Normal range of motion. Neck supple. No thyromegaly present.  Cardiovascular: Normal rate, regular rhythm, normal heart sounds and intact distal pulses.  No murmur heard. Pulmonary/Chest: Effort normal and breath sounds normal. No respiratory distress. He has no wheezes.  Abdominal: Soft. Bowel sounds are normal. He exhibits no distension. There is no tenderness.  Musculoskeletal: Normal range of motion. He exhibits no edema or tenderness.  Neurological: He is alert and oriented to person, place, and time.  Skin: Skin is warm and dry. No rash noted. No erythema.  Psychiatric: He has a normal mood and affect. His behavior is normal. Judgment and thought content normal.  Vitals reviewed.    BP 124/78   Pulse (!) 53    Temp (!) 97.2 F (36.2 C) (Oral)   Ht 6\' 1"  (1.854 m)   Wt 215 lb (97.5 kg)   BMI 28.37 kg/m      Assessment & Plan:  1. Acute otitis externa of right ear, unspecified type Keep clean and dry Avoid getting water into ear  Do not scratch RTO prn  - acetic acid-hydrocortisone (VOSOL-HC) OTIC solution; Place 4 drops into the right ear 3 (three) times daily.  Dispense: 10 mL; Refill: 0   Evelina Dun, FNP

## 2018-03-12 NOTE — Patient Instructions (Signed)

## 2018-03-13 ENCOUNTER — Telehealth: Payer: Self-pay | Admitting: Family

## 2018-03-13 MED ORDER — AMOXICILLIN 500 MG PO CAPS: 500.0000 mg | ORAL_CAPSULE | Freq: Two times a day (BID) | ORAL | 0 refills | Status: DC

## 2018-03-13 NOTE — Telephone Encounter (Signed)
Amoxicillin Prescription sent to pharmacy.

## 2018-03-13 NOTE — Telephone Encounter (Signed)
Pt aware.

## 2020-06-02 ENCOUNTER — Encounter: Payer: Self-pay | Admitting: Physician Assistant

## 2020-06-02 ENCOUNTER — Ambulatory Visit (INDEPENDENT_AMBULATORY_CARE_PROVIDER_SITE_OTHER): Payer: Medicare Other | Admitting: Physician Assistant

## 2020-06-02 ENCOUNTER — Other Ambulatory Visit: Payer: Self-pay

## 2020-06-02 VITALS — BP 118/71 | HR 58 | Temp 98.3°F | Ht 73.0 in | Wt 213.4 lb

## 2020-06-02 DIAGNOSIS — Z0001 Encounter for general adult medical examination with abnormal findings: Secondary | ICD-10-CM

## 2020-06-02 DIAGNOSIS — Z0000 Encounter for general adult medical examination without abnormal findings: Secondary | ICD-10-CM

## 2020-06-02 DIAGNOSIS — R0789 Other chest pain: Secondary | ICD-10-CM | POA: Diagnosis not present

## 2020-06-02 NOTE — Patient Instructions (Signed)
Bradycardia, Adult Bradycardia is a slower-than-normal heartbeat. A normal resting heart rate for an adult ranges from 60 to 100 beats per minute. With bradycardia, the resting heart rate is less than 60 beats per minute. Bradycardia can prevent enough oxygen from reaching certain areas of your body when you are active. It can be serious if it keeps enough oxygen from reaching your brain and other parts of your body. Bradycardia is not a problem for everyone. For some healthy adults, a slow resting heart rate is normal. What are the causes? This condition may be caused by:  A problem with the heart, including: ? A problem with the heart's electrical system, such as a heart block. With a heart block, electrical signals between the chambers of the heart are partially or completely blocked, so they are not able to work as they should. ? A problem with the heart's natural pacemaker (sinus node). ? Heart disease. ? A heart attack. ? Heart damage. ? Lyme disease. ? A heart infection. ? A heart condition that is present at birth (congenital heart defect).  Certain medicines that treat heart conditions.  Certain conditions, such as hypothyroidism and obstructive sleep apnea.  Problems with the balance of chemicals and other substances, like potassium, in the blood.  Trauma.  Radiation therapy. What increases the risk? You are more likely to develop this condition if you:  Are age 65 or older.  Have high blood pressure (hypertension), high cholesterol (hyperlipidemia), or diabetes.  Drink heavily, use tobacco or nicotine products, or use drugs. What are the signs or symptoms? Symptoms of this condition include:  Light-headedness.  Feeling faint or fainting.  Fatigue and weakness.  Trouble with activity or exercise.  Shortness of breath.  Chest pain (angina).  Drowsiness.  Confusion.  Dizziness. How is this diagnosed? This condition may be diagnosed based on:  Your  symptoms.  Your medical history.  A physical exam. During the exam, your health care provider will listen to your heartbeat and check your pulse. To confirm the diagnosis, your health care provider may order tests, such as:  Blood tests.  An electrocardiogram (ECG). This test records the heart's electrical activity. The test can show how fast your heart is beating and whether the heartbeat is steady.  A test in which you wear a portable device (event recorder or Holter monitor) to record your heart's electrical activity while you go about your day.  Anexercise test. How is this treated? Treatment for this condition depends on the cause of the condition and how severe your symptoms are. Treatment may involve:  Treatment of the underlying condition.  Changing your medicines or how much medicine you take.  Having a small, battery-operated device called a pacemaker implanted under the skin. When bradycardia occurs, this device can be used to increase your heart rate and help your heart beat in a regular rhythm. Follow these instructions at home: Lifestyle   Manage any health conditions that contribute to bradycardia as told by your health care provider.  Follow a heart-healthy diet. A nutrition specialist (dietitian) can help educate you about healthy food options and changes.  Follow an exercise program that is approved by your health care provider.  Maintain a healthy weight.  Try to reduce or manage your stress, such as with yoga or meditation. If you need help reducing stress, ask your health care provider.  Do not use any products that contain nicotine or tobacco, such as cigarettes, e-cigarettes, and chewing tobacco. If you need help   quitting, ask your health care provider.  Do not use illegal drugs.  Limit alcohol intake to no more than 1 drink a day for nonpregnant women and 2 drinks a day for men. Be aware of how much alcohol is in your drink. In the [place] one drink  equals one 12 oz bottle of beer (355 mL), one 5 oz glass of wine (148 mL), or one 1 oz glass of hard liquor (44 mL). General instructions  Take over-the-counter and prescription medicines only as told by your health care provider.  Keep all follow-up visits as told by your health care provider. This is important. How is this prevented? In some cases, bradycardia may be prevented by:  Treating underlying medical problems.  Stopping behaviors or medicines that can trigger the condition. Contact a health care provider if you:  Feel light-headed or dizzy.  Almost faint.  Feel weak or are easily fatigued during physical activity.  Experience confusion or have memory problems. Get help right away if:  You faint.  You have: ? An irregular heartbeat (palpitations). ? Chest pain. ? Trouble breathing. Summary  Bradycardia is a slower-than-normal heartbeat. With bradycardia, the resting heart rate is less than 60 beats per minute.  Treatment for this condition depends on the cause.  Manage any health conditions that contribute to bradycardia as told by your health care provider.  Do not use any products that contain nicotine or tobacco, such as cigarettes, e-cigarettes, and chewing tobacco, and limit alcohol intake.  Keep all follow-up visits as told by your health care provider. This is important. This information is not intended to replace advice given to you by your health care provider. Make sure you discuss any questions you have with your health care provider. Document Revised: 06/01/2018 Document Reviewed: 04/29/2018 Elsevier Patient Education  2020 Elsevier Inc.  

## 2020-06-02 NOTE — Progress Notes (Signed)
Subjective:     Patient ID: Trevor Contreras, male   DOB: Aug 08, 1956, 64 y.o.   MRN: [id number]  HPI Pt here for CPE Sig hx of anemia due to Colon Ca Pt keeps regular f/u with his GI Pt has been doing well except for intermit chest pain States very 2-3 months pt will feel some back pain that then will cause some nausea There is assoc numbness feeling to the upper ext and lightheadedness Sx will last for 20-30 sec and spont resolve Sx are not related to activities or working out After further questioning his does admit to intermit heart palp that may happen 1-2 month Again these will spont resolve Pt is very active with riding 30-45 min per day with weight training as well  Review of Systems  Constitutional: Negative.   HENT: Negative.   Respiratory: Negative.   Cardiovascular: Negative.   Gastrointestinal: Negative.   Genitourinary: Negative.   Neurological: Positive for dizziness, weakness and light-headedness.       Only with episodes       Objective:   Physical Exam Vitals and nursing note reviewed.  Constitutional:      General: He is not in acute distress.    Appearance: Normal appearance. He is normal weight. He is not ill-appearing or toxic-appearing.  HENT:     Head: Normocephalic and atraumatic.     Right Ear: Tympanic membrane, ear canal and external ear normal.     Left Ear: Tympanic membrane, ear canal and external ear normal.  Neck:     Vascular: No carotid bruit.  Cardiovascular:     Rate and Rhythm: Normal rate and regular rhythm.     Pulses: Normal pulses.     Heart sounds: Normal heart sounds.  Pulmonary:     Effort: Pulmonary effort is normal.     Breath sounds: Normal breath sounds.  Abdominal:     General: There is no distension.     Palpations: Abdomen is soft. There is no mass.     Tenderness: There is no abdominal tenderness. There is no right CVA tenderness, left CVA tenderness, guarding or rebound.     Hernia: No hernia is present.   Musculoskeletal:        General: No swelling or deformity. Normal range of motion.     Cervical back: Normal range of motion and neck supple.     Right lower leg: No edema.     Left lower leg: No edema.  Lymphadenopathy:     Cervical: No cervical adenopathy.  Skin:    General: Skin is warm.     Comments: Multiple healing lesions to the upper ext due to recent Derm appt  Neurological:     General: No focal deficit present.     Mental Status: He is alert and oriented to person, place, and time.     Gait: Gait normal.  Psychiatric:        Mood and Affect: Mood normal.        Behavior: Behavior normal.        Thought Content: Thought content normal.        Judgment: Judgment normal.        Assessment:     1. Chest discomfort   2. Physical exam        Plan:     Full labs ordered today Continue with reg f/u to Derm Continue with reg /fu with GI for colonoscopy Discussed with pt today if his sx may be related  to symptomatic bradycardia Will attempt to set up event monitor - if unsuccessful refer to Card Will inform of lab test Pt has appt with opt in future Keep regular dental visit F/U pending labs

## 2020-06-03 LAB — CBC WITH DIFFERENTIAL/PLATELET
Basophils Absolute: 0.1 10*3/uL (ref 0.0–0.2)
Basos: 1 %
EOS (ABSOLUTE): 0.1 10*3/uL (ref 0.0–0.4)
Eos: 2 %
Hematocrit: 40.5 % (ref 37.5–51.0)
Hemoglobin: 13.6 g/dL (ref 13.0–17.7)
Immature Grans (Abs): 0 10*3/uL (ref 0.0–0.1)
Immature Granulocytes: 0 %
Lymphocytes Absolute: 2 10*3/uL (ref 0.7–3.1)
Lymphs: 34 %
MCH: 29.2 pg (ref 26.6–33.0)
MCHC: 33.6 g/dL (ref 31.5–35.7)
MCV: 87 fL (ref 79–97)
Monocytes Absolute: 0.6 10*3/uL (ref 0.1–0.9)
Monocytes: 10 %
Neutrophils Absolute: 3.1 10*3/uL (ref 1.4–7.0)
Neutrophils: 53 %
Platelets: 218 10*3/uL (ref 150–450)
RBC: 4.66 x10E6/uL (ref 4.14–5.80)
RDW: 13.6 % (ref 11.6–15.4)
WBC: 5.8 10*3/uL (ref 3.4–10.8)

## 2020-06-03 LAB — CMP14+EGFR
ALT: 22 IU/L (ref 0–44)
AST: 22 IU/L (ref 0–40)
Albumin/Globulin Ratio: 1.8 (ref 1.2–2.2)
Albumin: 4.5 g/dL (ref 3.8–4.8)
Alkaline Phosphatase: 96 IU/L (ref 48–121)
BUN/Creatinine Ratio: 18 (ref 10–24)
BUN: 19 mg/dL (ref 8–27)
Bilirubin Total: 0.6 mg/dL (ref 0.0–1.2)
CO2: 22 mmol/L (ref 20–29)
Calcium: 9.5 mg/dL (ref 8.6–10.2)
Chloride: 103 mmol/L (ref 96–106)
Creatinine, Ser: 1.03 mg/dL (ref 0.76–1.27)
GFR calc Af Amer: 88 mL/min/{1.73_m2} (ref >59)
GFR calc non Af Amer: 76 mL/min/{1.73_m2} (ref >59)
Globulin, Total: 2.5 g/dL (ref 1.5–4.5)
Glucose: 90 mg/dL (ref 65–99)
Potassium: 4.5 mmol/L (ref 3.5–5.2)
Sodium: 139 mmol/L (ref 134–144)
Total Protein: 7 g/dL (ref 6.0–8.5)

## 2020-06-03 LAB — LIPID PANEL
Chol/HDL Ratio: 2.8 ratio (ref 0.0–5.0)
Cholesterol, Total: 196 mg/dL (ref 100–199)
HDL: 71 mg/dL (ref >39)
LDL Chol Calc (NIH): 109 mg/dL — ABNORMAL HIGH (ref 0–99)
Triglycerides: 88 mg/dL (ref 0–149)
VLDL Cholesterol Cal: 16 mg/dL (ref 5–40)

## 2020-06-03 LAB — PSA: Prostate Specific Ag, Serum: 1.2 ng/mL (ref 0.0–4.0)

## 2020-06-07 ENCOUNTER — Telehealth: Payer: Self-pay | Admitting: Family

## 2020-06-07 NOTE — Telephone Encounter (Signed)
Patient aware of lab results.

## 2020-06-16 ENCOUNTER — Encounter: Payer: Self-pay | Admitting: General Practice

## 2020-11-29 ENCOUNTER — Other Ambulatory Visit: Payer: BC Managed Care – PPO

## 2021-11-15 ENCOUNTER — Encounter: Payer: Self-pay | Admitting: Family

## 2021-11-15 ENCOUNTER — Ambulatory Visit (INDEPENDENT_AMBULATORY_CARE_PROVIDER_SITE_OTHER): Payer: Medicare Other | Admitting: Family

## 2021-11-15 VITALS — BP 115/69 | HR 62 | Temp 97.2°F | Ht 73.0 in | Wt 216.6 lb

## 2021-11-15 DIAGNOSIS — Z0000 Encounter for general adult medical examination without abnormal findings: Secondary | ICD-10-CM | POA: Diagnosis not present

## 2021-11-15 NOTE — Progress Notes (Signed)
Subjective:    Patient ID: Trevor Contreras, male    DOB: [birth year], 65 y.o.   MRN: [id number]  Chief Complaint  Patient presents with   Medical Management of Chronic Issues    Would like PSA    HPI Pt presents to the office today CPE. Pt denies any headache, palpitations, SOB, or edema at this time. Currently not taking any medications at this time.   He is scheduled for rotator cuff surgery on 12/04/21.    Review of Systems  All other systems reviewed and are negative.  Family History  Problem Relation Age of Onset   Cancer Mother        breast   Cancer Father        lung   Colon cancer Neg Hx    Social History   Socioeconomic History   Marital status: Married    Spouse name: Not on file   Number of children: Not on file   Years of education: Not on file   Highest education level: Not on file  Occupational History   Not on file  Tobacco Use   Smoking status: Never   Smokeless tobacco: Never  Substance and Sexual Activity   Alcohol use: Yes    Comment: 2-3 times weekly, beer   Drug use: No   Sexual activity: Not on file  Other Topics Concern   Not on file  Social History Narrative   Not on file   Social Determinants of Health   Financial Resource Strain: Not on file  Food Insecurity: Not on file  Transportation Needs: Not on file  Physical Activity: Not on file  Stress: Not on file  Social Connections: Not on file       Objective:   Physical Exam Vitals reviewed.  Constitutional:      General: He is not in acute distress.    Appearance: He is well-developed.  HENT:     Head: Normocephalic.     Right Ear: Tympanic membrane normal.     Left Ear: Tympanic membrane normal.  Eyes:     General:        Right eye: No discharge.        Left eye: No discharge.     Pupils: Pupils are equal, round, and reactive to light.  Neck:     Thyroid: No thyromegaly.  Cardiovascular:     Rate and Rhythm: Normal rate and regular rhythm.     Heart sounds: Normal  heart sounds. No murmur heard. Pulmonary:     Effort: Pulmonary effort is normal. No respiratory distress.     Breath sounds: Normal breath sounds. No wheezing.  Abdominal:     General: Bowel sounds are normal. There is no distension.     Palpations: Abdomen is soft.     Tenderness: There is no abdominal tenderness.  Musculoskeletal:        General: No tenderness. Normal range of motion.     Cervical back: Normal range of motion and neck supple.  Skin:    General: Skin is warm and dry.     Findings: No erythema or rash.  Neurological:     Mental Status: He is alert and oriented to person, place, and time.     Cranial Nerves: No cranial nerve deficit.     Deep Tendon Reflexes: Reflexes are normal and symmetric.  Psychiatric:        Behavior: Behavior normal.        Thought Content: Thought content  normal.        Judgment: Judgment normal.    BP 115/69    Pulse 62    Temp (!) 97.2 F (36.2 C) (Temporal)    Ht 6' 1"  (1.854 m)    Wt 216 lb 9.6 oz (98.2 kg)    SpO2 97%    BMI 28.58 kg/m        Assessment & Plan:  Georgios Kina comes in today with chief complaint of Medical Management of Chronic Issues (Would like PSA)   Diagnosis and orders addressed:  1. Annual physical exam - CMP14+EGFR - CBC with Differential/Platelet - Lipid panel - PSA, total and free - TSH - Hepatitis C antibody   Labs pending Health Maintenance reviewed Diet and exercise encouraged  Follow up plan: 1 year    Evelina Dun, FNP

## 2021-11-15 NOTE — Patient Instructions (Signed)
Health Maintenance After Age 67 After age 67, you are at a higher risk for certain long-term diseases and infections as well as injuries from falls. Falls are a major cause of broken bones and head injuries in people who are older than age 67. Getting regular preventive care can help to keep you healthy and well. Preventive care includes getting regular testing and making lifestyle changes as recommended by your health care provider. Talk with your health care provider about: Which screenings and tests you should have. A screening is a test that checks for a disease when you have no symptoms. A diet and exercise plan that is right for you. What should I know about screenings and tests to prevent falls? Screening and testing are the best ways to find a health problem early. Early diagnosis and treatment give you the best chance of managing medical conditions that are common after age 67. Certain conditions and lifestyle choices may make you more likely to have a fall. Your health care provider may recommend: Regular vision checks. Poor vision and conditions such as cataracts can make you more likely to have a fall. If you wear glasses, make sure to get your prescription updated if your vision changes. Medicine review. Work with your health care provider to regularly review all of the medicines you are taking, including over-the-counter medicines. Ask your health care provider about any side effects that may make you more likely to have a fall. Tell your health care provider if any medicines that you take make you feel dizzy or sleepy. Strength and balance checks. Your health care provider may recommend certain tests to check your strength and balance while standing, walking, or changing positions. Foot health exam. Foot pain and numbness, as well as not wearing proper footwear, can make you more likely to have a fall. Screenings, including: Osteoporosis screening. Osteoporosis is a condition that causes  the bones to get weaker and break more easily. Blood pressure screening. Blood pressure changes and medicines to control blood pressure can make you feel dizzy. Depression screening. You may be more likely to have a fall if you have a fear of falling, feel depressed, or feel unable to do activities that you used to do. Alcohol use screening. Using too much alcohol can affect your balance and may make you more likely to have a fall. Follow these instructions at home: Lifestyle Do not drink alcohol if: Your health care provider tells you not to drink. If you drink alcohol: Limit how much you have to: 0-1 drink a day for women. 0-2 drinks a day for men. Know how much alcohol is in your drink. In the [place] one drink equals one 12 oz bottle of beer (355 mL), one 5 oz glass of wine (148 mL), or one 1 oz glass of hard liquor (44 mL). Do not use any products that contain nicotine or tobacco. These products include cigarettes, chewing tobacco, and vaping devices, such as e-cigarettes. If you need help quitting, ask your health care provider. Activity  Follow a regular exercise program to stay fit. This will help you maintain your balance. Ask your health care provider what types of exercise are appropriate for you. If you need a cane or walker, use it as recommended by your health care provider. Wear supportive shoes that have nonskid soles. Safety  Remove any tripping hazards, such as rugs, cords, and clutter. Install safety equipment such as grab bars in bathrooms and safety rails on stairs. Keep rooms and walkways   well-lit. General instructions Talk with your health care provider about your risks for falling. Tell your health care provider if: You fall. Be sure to tell your health care provider about all falls, even ones that seem minor. You feel dizzy, tiredness (fatigue), or off-balance. Take over-the-counter and prescription medicines only as told by your health care provider. These include  supplements. Eat a healthy diet and maintain a healthy weight. A healthy diet includes low-fat dairy products, low-fat (lean) meats, and fiber from whole grains, beans, and lots of fruits and vegetables. Stay current with your vaccines. Schedule regular health, dental, and eye exams. Summary Having a healthy lifestyle and getting preventive care can help to protect your health and wellness after age 67. Screening and testing are the best way to find a health problem early and help you avoid having a fall. Early diagnosis and treatment give you the best chance for managing medical conditions that are more common for people who are older than age 67. Falls are a major cause of broken bones and head injuries in people who are older than age 67. Take precautions to prevent a fall at home. Work with your health care provider to learn what changes you can make to improve your health and wellness and to prevent falls. This information is not intended to replace advice given to you by your health care provider. Make sure you discuss any questions you have with your health care provider. Document Revised: 04/09/2021 Document Reviewed: 04/09/2021 Elsevier Patient Education  2022 Elsevier Inc.  

## 2021-11-16 LAB — PSA, TOTAL AND FREE
PSA, Free Pct: 23.3 %
PSA, Free: 0.28 ng/mL
Prostate Specific Ag, Serum: 1.2 ng/mL (ref 0.0–4.0)

## 2021-11-16 LAB — CMP14+EGFR
ALT: 29 IU/L (ref 0–44)
AST: 36 IU/L (ref 0–40)
Albumin/Globulin Ratio: 2.1 (ref 1.2–2.2)
Albumin: 4.2 g/dL (ref 3.8–4.8)
Alkaline Phosphatase: 96 IU/L (ref 44–121)
BUN/Creatinine Ratio: 18 (ref 10–24)
BUN: 18 mg/dL (ref 8–27)
Bilirubin Total: 0.4 mg/dL (ref 0.0–1.2)
CO2: 22 mmol/L (ref 20–29)
Calcium: 9.1 mg/dL (ref 8.6–10.2)
Chloride: 101 mmol/L (ref 96–106)
Creatinine, Ser: 1 mg/dL (ref 0.76–1.27)
Globulin, Total: 2 g/dL (ref 1.5–4.5)
Glucose: 83 mg/dL (ref 70–99)
Potassium: 4.8 mmol/L (ref 3.5–5.2)
Sodium: 136 mmol/L (ref 134–144)
Total Protein: 6.2 g/dL (ref 6.0–8.5)
eGFR: 82 mL/min/{1.73_m2} (ref >59)

## 2021-11-16 LAB — CBC WITH DIFFERENTIAL/PLATELET
Basophils Absolute: 0.1 10*3/uL (ref 0.0–0.2)
Basos: 2 %
EOS (ABSOLUTE): 0.2 10*3/uL (ref 0.0–0.4)
Eos: 4 %
Hematocrit: 38.4 % (ref 37.5–51.0)
Hemoglobin: 13 g/dL (ref 13.0–17.7)
Immature Grans (Abs): 0 10*3/uL (ref 0.0–0.1)
Immature Granulocytes: 0 %
Lymphocytes Absolute: 2.1 10*3/uL (ref 0.7–3.1)
Lymphs: 35 %
MCH: 29.1 pg (ref 26.6–33.0)
MCHC: 33.9 g/dL (ref 31.5–35.7)
MCV: 86 fL (ref 79–97)
Monocytes Absolute: 0.6 10*3/uL (ref 0.1–0.9)
Monocytes: 11 %
Neutrophils Absolute: 2.9 10*3/uL (ref 1.4–7.0)
Neutrophils: 48 %
Platelets: 223 10*3/uL (ref 150–450)
RBC: 4.46 x10E6/uL (ref 4.14–5.80)
RDW: 13.1 % (ref 11.6–15.4)
WBC: 5.9 10*3/uL (ref 3.4–10.8)

## 2021-11-16 LAB — HEPATITIS C ANTIBODY: Hep C Virus Ab: <0.1 s/co ratio (ref 0.0–0.9)

## 2021-11-16 LAB — LIPID PANEL
Chol/HDL Ratio: 2.7 ratio (ref 0.0–5.0)
Cholesterol, Total: 188 mg/dL (ref 100–199)
HDL: 69 mg/dL (ref >39)
LDL Chol Calc (NIH): 103 mg/dL — ABNORMAL HIGH (ref 0–99)
Triglycerides: 91 mg/dL (ref 0–149)
VLDL Cholesterol Cal: 16 mg/dL (ref 5–40)

## 2021-11-16 LAB — TSH: TSH: 2.72 u[IU]/mL (ref 0.450–4.500)

## 2021-11-19 ENCOUNTER — Other Ambulatory Visit: Payer: Self-pay | Admitting: Family

## 2021-11-19 MED ORDER — ROSUVASTATIN CALCIUM 10 MG PO TABS: 10.0000 mg | ORAL_TABLET | Freq: Every day | ORAL | 3 refills | Status: DC

## 2021-12-05 ENCOUNTER — Encounter: Payer: Self-pay | Admitting: Internal Medicine

## 2021-12-19 ENCOUNTER — Encounter: Payer: Self-pay | Admitting: Family Medicine

## 2021-12-26 ENCOUNTER — Ambulatory Visit: Payer: Medicare Other

## 2022-01-03 DIAGNOSIS — M75122 Complete rotator cuff tear or rupture of left shoulder, not specified as traumatic: Secondary | ICD-10-CM | POA: Diagnosis not present

## 2022-01-08 DIAGNOSIS — M75122 Complete rotator cuff tear or rupture of left shoulder, not specified as traumatic: Secondary | ICD-10-CM | POA: Diagnosis not present

## 2022-01-10 DIAGNOSIS — M75122 Complete rotator cuff tear or rupture of left shoulder, not specified as traumatic: Secondary | ICD-10-CM | POA: Diagnosis not present

## 2022-01-15 DIAGNOSIS — M75122 Complete rotator cuff tear or rupture of left shoulder, not specified as traumatic: Secondary | ICD-10-CM | POA: Diagnosis not present

## 2022-01-17 DIAGNOSIS — M75122 Complete rotator cuff tear or rupture of left shoulder, not specified as traumatic: Secondary | ICD-10-CM | POA: Diagnosis not present

## 2022-01-22 DIAGNOSIS — M75122 Complete rotator cuff tear or rupture of left shoulder, not specified as traumatic: Secondary | ICD-10-CM | POA: Diagnosis not present

## 2022-01-25 DIAGNOSIS — M75122 Complete rotator cuff tear or rupture of left shoulder, not specified as traumatic: Secondary | ICD-10-CM | POA: Diagnosis not present

## 2022-01-29 DIAGNOSIS — M75122 Complete rotator cuff tear or rupture of left shoulder, not specified as traumatic: Secondary | ICD-10-CM | POA: Diagnosis not present

## 2022-02-01 DIAGNOSIS — M75122 Complete rotator cuff tear or rupture of left shoulder, not specified as traumatic: Secondary | ICD-10-CM | POA: Diagnosis not present

## 2022-02-05 DIAGNOSIS — M75122 Complete rotator cuff tear or rupture of left shoulder, not specified as traumatic: Secondary | ICD-10-CM | POA: Diagnosis not present

## 2022-02-08 DIAGNOSIS — M75122 Complete rotator cuff tear or rupture of left shoulder, not specified as traumatic: Secondary | ICD-10-CM | POA: Diagnosis not present

## 2022-02-12 DIAGNOSIS — M75122 Complete rotator cuff tear or rupture of left shoulder, not specified as traumatic: Secondary | ICD-10-CM | POA: Diagnosis not present

## 2022-02-18 DIAGNOSIS — H61001 Unspecified perichondritis of right external ear: Secondary | ICD-10-CM | POA: Diagnosis not present

## 2022-02-18 DIAGNOSIS — Z85820 Personal history of malignant melanoma of skin: Secondary | ICD-10-CM | POA: Diagnosis not present

## 2022-02-18 DIAGNOSIS — L570 Actinic keratosis: Secondary | ICD-10-CM | POA: Diagnosis not present

## 2022-02-18 DIAGNOSIS — D485 Neoplasm of uncertain behavior of skin: Secondary | ICD-10-CM | POA: Diagnosis not present

## 2022-02-18 DIAGNOSIS — Z85828 Personal history of other malignant neoplasm of skin: Secondary | ICD-10-CM | POA: Diagnosis not present

## 2022-02-20 DIAGNOSIS — M75122 Complete rotator cuff tear or rupture of left shoulder, not specified as traumatic: Secondary | ICD-10-CM | POA: Diagnosis not present

## 2022-02-26 DIAGNOSIS — M75122 Complete rotator cuff tear or rupture of left shoulder, not specified as traumatic: Secondary | ICD-10-CM | POA: Diagnosis not present

## 2022-03-01 ENCOUNTER — Ambulatory Visit (AMBULATORY_SURGERY_CENTER): Payer: Medicare Other | Admitting: *Deleted

## 2022-03-01 VITALS — Ht 73.0 in | Wt 215.0 lb

## 2022-03-01 DIAGNOSIS — Z85038 Personal history of other malignant neoplasm of large intestine: Secondary | ICD-10-CM

## 2022-03-01 NOTE — Progress Notes (Signed)
No egg or soy allergy known to patient  ?No issues known to pt with past sedation with any surgeries or procedures ?Patient denies ever being told they had issues or difficulty with intubation  ?No FH of Malignant Hyperthermia ?Pt is not on diet pills ?Pt is not on  home 02  ?Pt is not on blood thinners  ?Pt denies issues with constipation  ?No A fib or A flutter ? ?Pt is fully vaccinated  for Covid  ? ? ?Due to the COVID-19 pandemic we are asking patients to follow certain guidelines in PV and the [place]   ?Pt aware of COVID protocols and [facility] guidelines  ? ?PV completed over the phone. Pt verified name, DOB, address and insurance during PV today.  ?Pt mailed instruction packet with copy of consent form to read and not return, and instructions.  ?Pt encouraged to call with questions or issues.  ?   ?

## 2022-03-05 ENCOUNTER — Encounter: Payer: Self-pay | Admitting: Internal Medicine

## 2022-03-10 ENCOUNTER — Encounter: Payer: Self-pay | Admitting: Certified Registered Nurse Anesthetist

## 2022-03-15 ENCOUNTER — Ambulatory Visit (AMBULATORY_SURGERY_CENTER): Payer: Medicare Other | Admitting: Internal Medicine

## 2022-03-15 ENCOUNTER — Encounter: Payer: Self-pay | Admitting: Internal Medicine

## 2022-03-15 VITALS — BP 109/70 | HR 52 | Temp 98.7°F | Resp 14 | Ht 73.0 in | Wt 215.0 lb

## 2022-03-15 DIAGNOSIS — Z85038 Personal history of other malignant neoplasm of large intestine: Secondary | ICD-10-CM

## 2022-03-15 MED ORDER — SODIUM CHLORIDE 0.9 % IV SOLN: 500.0000 mL | INTRAVENOUS | Status: DC

## 2022-03-15 NOTE — Progress Notes (Signed)
Pt's states no medical or surgical changes since previsit or office visit. 

## 2022-03-15 NOTE — Progress Notes (Signed)
[facility] History and Physical ? ? ?Primary Care Physician:  Sharion Balloon, FNP ? ? ?Reason for Procedure:   Personal history of colon cancer ? ?Plan:    colonoscopy ? ? ? ? ?HPI: Trevor Contreras is a 66 y.o. male here for surveillance exam - hx colon cancer 2002 ? ? ?Past Medical History:  ?Diagnosis Date  ? Colon cancer [facility]) 2002  ? ? ?Past Surgical History:  ?Procedure Laterality Date  ? COLON SURGERY    ? COLONOSCOPY    ? EYE SURGERY    ? trying to correct blurred vision in left eye; unsuccessful  ? KNEE ARTHROSCOPY    ? bilat; several surgeries  ? SHOULDER ARTHROSCOPY    ? rotator cuff, reattach part of long bicep, Dr Ivin Booty  ? ? ?Prior to Admission medications   ?Medication Sig Start Date End Date Taking? Authorizing Provider  ?acetaminophen (TYLENOL) 500 MG tablet Take by mouth.   Yes [provider]  ?diclofenac (VOLTAREN) 75 MG EC tablet SMARTSIG:1 Tablet(s) By Mouth Morning-Night PRN ?Patient not taking: Reported on 03/15/2022 02/18/22   [provider]  ?rosuvastatin (CRESTOR) 10 MG tablet Take 1 tablet (10 mg total) by mouth daily. ?Patient not taking: Reported on 03/01/2022 11/19/21   Sharion Balloon, FNP  ? ? ?Current Outpatient Medications  ?Medication Sig Dispense Refill  ? acetaminophen (TYLENOL) 500 MG tablet Take by mouth.    ? diclofenac (VOLTAREN) 75 MG EC tablet SMARTSIG:1 Tablet(s) By Mouth Morning-Night PRN (Patient not taking: Reported on 03/15/2022)    ? rosuvastatin (CRESTOR) 10 MG tablet Take 1 tablet (10 mg total) by mouth daily. (Patient not taking: Reported on 03/01/2022) 90 tablet 3  ? ?Current Facility-Administered Medications  ?Medication Dose Route Frequency Provider Last Rate Last Admin  ? 0.9 %  sodium chloride infusion  500 mL Intravenous Continuous Gatha Mayer, MD      ? ? ?Allergies as of 03/15/2022  ? (No Known Allergies)  ? ? ?Family History  ?Problem Relation Age of Onset  ? Cancer Mother   ?     breast  ? Cancer Father   ?     lung  ?  Colon cancer Neg Hx   ? Colon polyps Neg Hx   ? Esophageal cancer Neg Hx   ? Stomach cancer Neg Hx   ? Rectal cancer Neg Hx   ? ? ?Social History  ? ?Socioeconomic History  ? Marital status: Married  ?  Spouse name: Not on file  ? Number of children: Not on file  ? Years of education: Not on file  ? Highest education level: Not on file  ?Occupational History  ? Not on file  ?Tobacco Use  ? Smoking status: Never  ? Smokeless tobacco: Never  ?Vaping Use  ? Vaping Use: Never used  ?Substance and Sexual Activity  ? Alcohol use: Yes  ?  Comment: 2-3 times weekly, beer  ? Drug use: No  ? Sexual activity: Not on file  ?Other Topics Concern  ? Not on file  ?Social History Narrative  ? Not on file  ? ?Social Determinants of Health  ? ?Financial Resource Strain: Not on file  ?Food Insecurity: Not on file  ?Transportation Needs: Not on file  ?Physical Activity: Not on file  ?Stress: Not on file  ?Social Connections: Not on file  ?Intimate Partner Violence: Not on file  ? ? ?Review of Systems: ? ?All other review of systems negative except as mentioned in  the HPI. ? ?Physical Exam: ?Vital signs ?BP 120/83   Pulse (!) 58   Temp 98.7 ?F (37.1 ?C)   Resp 16   Ht '6\' 1"'$  (1.854 m)   Wt 215 lb (97.5 kg)   SpO2 99%   BMI 28.37 kg/m?  ? ?General:   Alert,  Well-developed, well-nourished, pleasant and cooperative in NAD ?Lungs:  Clear throughout to auscultation.   ?Heart:  Regular rate and rhythm; no murmurs, clicks, rubs,  or gallops. ?Abdomen:  Soft, nontender and nondistended. Normal bowel sounds.   ?Neuro/Psych:  Alert and cooperative. Normal mood and affect. A and O x 3 ? ? ?'@Jerlean Peralta'$  Simonne Maffucci, MD, [facility] ?[facility] ?[phone] (pager) ?03/15/2022 11:30 AM@ ? ?

## 2022-03-15 NOTE — Patient Instructions (Addendum)
Things look good again. Some diverticulosis as before. No polyps and no cancer. ?Expert guidelines recommend that you repeat a colonoscopy in 5 years. ? ?I appreciate the opportunity to care for you. ?Gatha Mayer, MD, [facility] ? ?Please read handouts provided. ?Continue present medications. ?Resume previous diet. ? ?YOU HAD AN ENDOSCOPIC PROCEDURE TODAY AT [facility]:   Refer to the procedure report that was given to you for any specific questions about what was found during the examination.  If the procedure report does not answer your questions, please call your gastroenterologist to clarify.  If you requested that your care partner not be given the details of your procedure findings, then the procedure report has been included in a sealed envelope for you to review at your convenience later. ? ?YOU SHOULD EXPECT: Some feelings of bloating in the abdomen. Passage of more gas than usual.  Walking can help get rid of the air that was put into your GI tract during the procedure and reduce the bloating. If you had a lower endoscopy (such as a colonoscopy or flexible sigmoidoscopy) you may notice spotting of blood in your stool or on the toilet paper. If you underwent a bowel prep for your procedure, you may not have a normal bowel movement for a few days. ? ?Please Note:  You might notice some irritation and congestion in your nose or some drainage.  This is from the oxygen used during your procedure.  There is no need for concern and it should clear up in a day or so. ? ?SYMPTOMS TO REPORT IMMEDIATELY: ? ?Following lower endoscopy (colonoscopy or flexible sigmoidoscopy): ? Excessive amounts of blood in the stool ? Significant tenderness or worsening of abdominal pains ? Swelling of the abdomen that is new, acute ? Fever of 100?F or higher ? ? ?For urgent or emergent issues, a gastroenterologist can be reached at any hour by calling [phone]. ?Do not use MyChart messaging for urgent concerns.   ? ? ?DIET:  We do recommend a small meal at first, but then you may proceed to your regular diet.  Drink plenty of fluids but you should avoid alcoholic beverages for 24 hours. ? ?ACTIVITY:  You should plan to take it easy for the rest of today and you should NOT DRIVE or use heavy machinery until tomorrow (because of the sedation medicines used during the test).   ? ?FOLLOW UP: ?Our staff will call the number listed on your records 48-72 hours following your procedure to check on you and address any questions or concerns that you may have regarding the information given to you following your procedure. If we do not reach you, we will leave a message.  We will attempt to reach you two times.  During this call, we will ask if you have developed any symptoms of COVID 19. If you develop any symptoms (ie: fever, flu-like symptoms, shortness of breath, cough etc.) before then, please call [phone].  If you test positive for Covid 19 in the 2 weeks post procedure, please call and report this information to [place].   ? ?If any biopsies were taken you will be contacted by phone or by letter within the next 1-3 weeks.  Please call us at [phone] if you have not heard about the biopsies in 3 weeks.  ? ? ?SIGNATURES/CONFIDENTIALITY: ?You and/or your care partner have signed paperwork which will be entered into your electronic medical record.  These signatures attest to the fact that  that the information above on your After Visit Summary has been reviewed and is understood.  Full responsibility of the confidentiality of this discharge information lies with you and/or your care-partner.  ?

## 2022-03-15 NOTE — Progress Notes (Signed)
Report given to PACU, vss 

## 2022-03-15 NOTE — Op Note (Signed)
[facility] ?Patient Name: Trevor Contreras ?Procedure Date: 03/15/2022 11:21 AM ?MRN: [id number] ?Endoscopist: Gatha Mayer , MD ?Age: 66 ?Referring MD:  ?Date of Birth: [birth year] ?Gender: Male ?Account #: [id number] ?Procedure:                Colonoscopy ?Indications:              High risk colon cancer surveillance: Personal  ?                          history of colon cancer, Last colonoscopy: 2017 ?Medicines:                Monitored Anesthesia Care ?Procedure:                Pre-Anesthesia Assessment: ?                          - Prior to the procedure, a History and Physical  ?                          was performed, and patient medications and  ?                          allergies were reviewed. The patient's tolerance of  ?                          previous anesthesia was also reviewed. The risks  ?                          and benefits of the procedure and the sedation  ?                          options and risks were discussed with the patient.  ?                          All questions were answered, and informed consent  ?                          was obtained. Prior Anticoagulants: The patient has  ?                          taken no previous anticoagulant or antiplatelet  ?                          agents. ASA Grade Assessment: II - A patient with  ?                          mild systemic disease. After reviewing the risks  ?                          and benefits, the patient was deemed in  ?                          satisfactory condition to undergo the procedure. ?  After obtaining informed consent, the colonoscope  ?                          was passed under direct vision. Throughout the  ?                          procedure, the patient's blood pressure, pulse, and  ?                          oxygen saturations were monitored continuously. The  ?                          CF [id number] #[id number] was introduced through the anus  ?                          and advanced to  the the cecum, identified by  ?                          appendiceal orifice and ileocecal valve. The  ?                          colonoscopy was performed without difficulty. The  ?                          patient tolerated the procedure well. The quality  ?                          of the bowel preparation was good. The rectum and  ?                          Ileocolonic anastomsis areas were photographed. ?Scope In: 11:39:21 AM ?Scope Out: 11:49:46 AM ?Scope Withdrawal Time: 0 hours 7 minutes 25 seconds  ?Total Procedure Duration: 0 hours 10 minutes 25 seconds  ?Findings:                 The perianal and digital rectal examinations were  ?                          normal. ?                          There was evidence of a prior end-to-side  ?                          ileo-colonic anastomosis in the transverse colon.  ?                          This was patent and was characterized by healthy  ?                          appearing mucosa. ?                          Multiple diverticula were found in the sigmoid  ?  colon. ?                          The exam was otherwise without abnormality on  ?                          direct and retroflexion views. ?Complications:            No immediate complications. ?Estimated Blood Loss:     Estimated blood loss: none. ?Impression:               - Patent end-to-side ileo-colonic anastomosis,  ?                          characterized by healthy appearing mucosa. ?                          - Diverticulosis in the sigmoid colon. ?                          - The examination was otherwise normal on direct  ?                          and retroflexion views. ?                          - No specimens collected. ?                          - Personal history of malignant neoplasm of the  ?                          colon. Ascending colon cancer resected 2002 ?Recommendation:           - Patient has a contact number available for  ?                           emergencies. The signs and symptoms of potential  ?                          delayed complications were discussed with the  ?                          patient. Return to normal activities tomorrow.  ?                          Written discharge instructions were provided to the  ?                          patient. ?                          - Resume previous diet. ?                          - Continue present medications. ?                          -  Repeat colonoscopy in 5 years for surveillance. ?Gatha Mayer, MD ?03/15/2022 11:56:22 AM ?This report has been signed electronically. ?

## 2022-03-19 ENCOUNTER — Telehealth: Payer: Self-pay | Admitting: *Deleted

## 2022-03-19 NOTE — Telephone Encounter (Signed)
?  Follow up Call- ? ? ?  03/15/2022  ? 10:55 AM 03/15/2022  ? 10:46 AM  ?Call back number  ?Post procedure Call Back phone  # [phone]   ?Permission to leave phone message Yes Yes  ?  ? ?Patient questions: ? ?Do you have a fever, pain , or abdominal swelling? No. ?Pain Score  0 * ? ?Have you tolerated food without any problems? Yes.   ? ?Have you been able to return to your normal activities? Yes.   ? ?Do you have any questions about your discharge instructions: ?Diet   No. ?Medications  No. ?Follow up visit  No. ? ?Do you have questions or concerns about your Care? No. ? ?Actions: ?* If pain score is 4 or above: ?No action needed, pain <4. ? ? ?

## 2022-03-21 ENCOUNTER — Other Ambulatory Visit: Payer: Self-pay

## 2022-04-08 DIAGNOSIS — H524 Presbyopia: Secondary | ICD-10-CM | POA: Diagnosis not present

## 2022-04-08 DIAGNOSIS — H5203 Hypermetropia, bilateral: Secondary | ICD-10-CM | POA: Diagnosis not present

## 2022-04-08 DIAGNOSIS — H2511 Age-related nuclear cataract, right eye: Secondary | ICD-10-CM | POA: Diagnosis not present

## 2022-04-17 DIAGNOSIS — M75122 Complete rotator cuff tear or rupture of left shoulder, not specified as traumatic: Secondary | ICD-10-CM | POA: Diagnosis not present

## 2022-07-01 DIAGNOSIS — Z85820 Personal history of malignant melanoma of skin: Secondary | ICD-10-CM | POA: Diagnosis not present

## 2022-07-01 DIAGNOSIS — D485 Neoplasm of uncertain behavior of skin: Secondary | ICD-10-CM | POA: Diagnosis not present

## 2022-07-01 DIAGNOSIS — L821 Other seborrheic keratosis: Secondary | ICD-10-CM | POA: Diagnosis not present

## 2022-07-01 DIAGNOSIS — I781 Nevus, non-neoplastic: Secondary | ICD-10-CM | POA: Diagnosis not present

## 2022-07-01 DIAGNOSIS — L820 Inflamed seborrheic keratosis: Secondary | ICD-10-CM | POA: Diagnosis not present

## 2022-07-01 DIAGNOSIS — D2272 Melanocytic nevi of left lower limb, including hip: Secondary | ICD-10-CM | POA: Diagnosis not present

## 2022-07-01 DIAGNOSIS — B358 Other dermatophytoses: Secondary | ICD-10-CM | POA: Diagnosis not present

## 2022-07-01 DIAGNOSIS — D224 Melanocytic nevi of scalp and neck: Secondary | ICD-10-CM | POA: Diagnosis not present

## 2022-07-01 DIAGNOSIS — L570 Actinic keratosis: Secondary | ICD-10-CM | POA: Diagnosis not present

## 2022-07-01 DIAGNOSIS — Z85828 Personal history of other malignant neoplasm of skin: Secondary | ICD-10-CM | POA: Diagnosis not present

## 2022-08-14 ENCOUNTER — Encounter: Payer: Self-pay | Admitting: Nurse Practitioner

## 2022-08-14 ENCOUNTER — Ambulatory Visit (INDEPENDENT_AMBULATORY_CARE_PROVIDER_SITE_OTHER): Payer: Medicare Other | Admitting: Nurse Practitioner

## 2022-08-14 ENCOUNTER — Ambulatory Visit (INDEPENDENT_AMBULATORY_CARE_PROVIDER_SITE_OTHER): Payer: Medicare Other

## 2022-08-14 VITALS — BP 138/69 | HR 65 | Temp 98.6°F | Ht 73.0 in | Wt 213.0 lb

## 2022-08-14 DIAGNOSIS — M5489 Other dorsalgia: Secondary | ICD-10-CM | POA: Diagnosis not present

## 2022-08-14 DIAGNOSIS — M545 Low back pain, unspecified: Secondary | ICD-10-CM | POA: Diagnosis not present

## 2022-08-14 NOTE — Progress Notes (Signed)
   Acute Office Visit  Subjective:     Patient ID: Trevor Contreras, male    DOB: 1956/09/16, 66 y.o.   MRN: [id number]  Chief Complaint  Patient presents with   Numbness    Sudden in his arms and back - last night pt states he went numb in his legs up to his belly    Back Pain This is a new problem. The current episode started yesterday. The problem occurs intermittently. The problem has been gradually improving since onset. The pain is present in the thoracic spine. The pain radiates to the right foot, left foot, right thigh and left thigh. The patient is experiencing no pain. The symptoms are aggravated by position. Stiffness is present At night. He has tried nothing for the symptoms.     Review of Systems  Constitutional: Negative.   HENT: Negative.    Respiratory: Negative.    Cardiovascular: Negative.   Genitourinary: Negative.   Musculoskeletal:  Positive for back pain.  Skin: Negative.  Negative for itching and rash.  All other systems reviewed and are negative.       Objective:    BP 138/69   Pulse 65   Temp 98.6 F (37 C)   Ht '6\' 1"'$  (1.854 m)   Wt 213 lb (96.6 kg)   SpO2 96%   BMI 28.10 kg/m  BP Readings from Last 3 Encounters:  08/14/22 138/69  03/15/22 109/70  11/15/21 115/69      Physical Exam Vitals and nursing note reviewed.  Constitutional:      Appearance: Normal appearance.  HENT:     Head: Normocephalic.     Right Ear: External ear normal.     Left Ear: External ear normal.     Nose: Nose normal.  Eyes:     Conjunctiva/sclera: Conjunctivae normal.  Cardiovascular:     Rate and Rhythm: Normal rate and regular rhythm.     Pulses: Normal pulses.     Heart sounds: Normal heart sounds.  Pulmonary:     Effort: Pulmonary effort is normal.     Breath sounds: Normal breath sounds.  Abdominal:     General: Bowel sounds are normal.  Skin:    General: Skin is warm.     Findings: No erythema or rash.  Neurological:     General: No focal deficit  present.     Mental Status: He is alert and oriented to person, place, and time.  Psychiatric:        Behavior: Behavior normal.     No results found for any visits on 08/14/22.      Assessment & Plan:  Recurrent thoracic back pain with weakness and slight numbness in arms and legs that goes away after a few minutes. Patient denies visual changes, facial numbness, on assessment I ruled out Cerebral Palsy and stroke. All neuro function intact.   Completed back x-ray. MRI and  neurology re-assessment in the future with unresolved symptoms  Problem List Items Addressed This Visit   None Visit Diagnoses     Back pain without sciatica    -  Primary   Relevant Orders   DG Lumbar Spine 2-3 Views       No orders of the defined types were placed in this encounter.   Return if symptoms worsen or fail to improve, for back pain.  Ivy Lynn, NP

## 2022-08-14 NOTE — Patient Instructions (Signed)
Acute Back Pain, Adult Acute back pain is sudden and usually short-lived. It is often caused by an injury to the muscles and tissues in the back. The injury may result from: A muscle, tendon, or ligament getting overstretched or torn. Ligaments are tissues that connect bones to each other. Lifting something improperly can cause a back strain. Wear and tear (degeneration) of the spinal disks. Spinal disks are circular tissue that provide cushioning between the bones of the spine (vertebrae). Twisting motions, such as while playing sports or doing yard work. A hit to the back. Arthritis. You may have a physical exam, lab tests, and imaging tests to find the cause of your pain. Acute back pain usually goes away with rest and home care. Follow these instructions at home: Managing pain, stiffness, and swelling Take over-the-counter and prescription medicines only as told by your health care provider. Treatment may include medicines for pain and inflammation that are taken by mouth or applied to the skin, or muscle relaxants. Your health care provider may recommend applying ice during the first 24-48 hours after your pain starts. To do this: Put ice in a plastic bag. Place a towel between your skin and the bag. Leave the ice on for 20 minutes, 2-3 times a day. Remove the ice if your skin turns bright red. This is very important. If you cannot feel pain, heat, or cold, you have a greater risk of damage to the area. If directed, apply heat to the affected area as often as told by your health care provider. Use the heat source that your health care provider recommends, such as a moist heat pack or a heating pad. Place a towel between your skin and the heat source. Leave the heat on for 20-30 minutes. Remove the heat if your skin turns bright red. This is especially important if you are unable to feel pain, heat, or cold. You have a greater risk of getting burned. Activity  Do not stay in bed. Staying in  bed for more than 1-2 days can delay your recovery. Sit up and stand up straight. Avoid leaning forward when you sit or hunching over when you stand. If you work at a desk, sit close to it so you do not need to lean over. Keep your chin tucked in. Keep your neck drawn back, and keep your elbows bent at a 90-degree angle (right angle). Sit high and close to the steering wheel when you drive. Add lower back (lumbar) support to your car seat, if needed. Take short walks on even surfaces as soon as you are able. Try to increase the length of time you walk each day. Do not sit, drive, or stand in one place for more than 30 minutes at a time. Sitting or standing for long periods of time can put stress on your back. Do not drive or use heavy machinery while taking prescription pain medicine. Use proper lifting techniques. When you bend and lift, use positions that put less stress on your back: Bend your knees. Keep the load close to your body. Avoid twisting. Exercise regularly as told by your health care provider. Exercising helps your back heal faster and helps prevent back injuries by keeping muscles strong and flexible. Work with a physical therapist to make a safe exercise program, as recommended by your health care provider. Do any exercises as told by your physical therapist. Lifestyle Maintain a healthy weight. Extra weight puts stress on your back and makes it difficult to have good   posture. Avoid activities or situations that make you feel anxious or stressed. Stress and anxiety increase muscle tension and can make back pain worse. Learn ways to manage anxiety and stress, such as through exercise. General instructions Sleep on a firm mattress in a comfortable position. Try lying on your side with your knees slightly bent. If you lie on your back, put a pillow under your knees. Keep your head and neck in a straight line with your spine (neutral position) when using electronic equipment like  smartphones or pads. To do this: Raise your smartphone or pad to look at it instead of bending your head or neck to look down. Put the smartphone or pad at the level of your face while looking at the screen. Follow your treatment plan as told by your health care provider. This may include: Cognitive or behavioral therapy. Acupuncture or massage therapy. Meditation or yoga. Contact a health care provider if: You have pain that is not relieved with rest or medicine. You have increasing pain going down into your legs or buttocks. Your pain does not improve after 2 weeks. You have pain at night. You lose weight without trying. You have a fever or chills. You develop nausea or vomiting. You develop abdominal pain. Get help right away if: You develop new bowel or bladder control problems. You have unusual weakness or numbness in your arms or legs. You feel faint. These symptoms may represent a serious problem that is an emergency. Do not wait to see if the symptoms will go away. Get medical help right away. Call your local emergency services (911 in the [place] Do not drive yourself to the hospital. Summary Acute back pain is sudden and usually short-lived. Use proper lifting techniques. When you bend and lift, use positions that put less stress on your back. Take over-the-counter and prescription medicines only as told by your health care provider, and apply heat or ice as told. This information is not intended to replace advice given to you by your health care provider. Make sure you discuss any questions you have with your health care provider. Document Revised: 02/09/2021 Document Reviewed: 02/09/2021 [employer]vier Patient Education  2023 [employer]  

## 2022-10-18 ENCOUNTER — Ambulatory Visit (INDEPENDENT_AMBULATORY_CARE_PROVIDER_SITE_OTHER): Payer: Medicare Other | Admitting: Family

## 2022-10-18 ENCOUNTER — Encounter: Payer: Self-pay | Admitting: Family

## 2022-10-18 VITALS — BP 134/70 | HR 75 | Temp 97.8°F | Ht 73.0 in | Wt 215.0 lb

## 2022-10-18 DIAGNOSIS — Z85038 Personal history of other malignant neoplasm of large intestine: Secondary | ICD-10-CM | POA: Diagnosis not present

## 2022-10-18 DIAGNOSIS — Z0000 Encounter for general adult medical examination without abnormal findings: Secondary | ICD-10-CM

## 2022-10-18 DIAGNOSIS — Z136 Encounter for screening for cardiovascular disorders: Secondary | ICD-10-CM | POA: Diagnosis not present

## 2022-10-18 NOTE — Patient Instructions (Signed)
Health Maintenance After Age 68 After age 68, you are at a higher risk for certain long-term diseases and infections as well as injuries from falls. Falls are a major cause of broken bones and head injuries in people who are older than age 68. Getting regular preventive care can help to keep you healthy and well. Preventive care includes getting regular testing and making lifestyle changes as recommended by your health care provider. Talk with your health care provider about: Which screenings and tests you should have. A screening is a test that checks for a disease when you have no symptoms. A diet and exercise plan that is right for you. What should I know about screenings and tests to prevent falls? Screening and testing are the best ways to find a health problem early. Early diagnosis and treatment give you the best chance of managing medical conditions that are common after age 68. Certain conditions and lifestyle choices may make you more likely to have a fall. Your health care provider may recommend: Regular vision checks. Poor vision and conditions such as cataracts can make you more likely to have a fall. If you wear glasses, make sure to get your prescription updated if your vision changes. Medicine review. Work with your health care provider to regularly review all of the medicines you are taking, including over-the-counter medicines. Ask your health care provider about any side effects that may make you more likely to have a fall. Tell your health care provider if any medicines that you take make you feel dizzy or sleepy. Strength and balance checks. Your health care provider may recommend certain tests to check your strength and balance while standing, walking, or changing positions. Foot health exam. Foot pain and numbness, as well as not wearing proper footwear, can make you more likely to have a fall. Screenings, including: Osteoporosis screening. Osteoporosis is a condition that causes  the bones to get weaker and break more easily. Blood pressure screening. Blood pressure changes and medicines to control blood pressure can make you feel dizzy. Depression screening. You may be more likely to have a fall if you have a fear of falling, feel depressed, or feel unable to do activities that you used to do. Alcohol use screening. Using too much alcohol can affect your balance and may make you more likely to have a fall. Follow these instructions at home: Lifestyle Do not drink alcohol if: Your health care provider tells you not to drink. If you drink alcohol: Limit how much you have to: 0-1 drink a day for women. 0-2 drinks a day for men. Know how much alcohol is in your drink. In the [place] one drink equals one 12 oz bottle of beer (355 mL), one 5 oz glass of wine (148 mL), or one 1 oz glass of hard liquor (44 mL). Do not use any products that contain nicotine or tobacco. These products include cigarettes, chewing tobacco, and vaping devices, such as e-cigarettes. If you need help quitting, ask your health care provider. Activity  Follow a regular exercise program to stay fit. This will help you maintain your balance. Ask your health care provider what types of exercise are appropriate for you. If you need a cane or walker, use it as recommended by your health care provider. Wear supportive shoes that have nonskid soles. Safety  Remove any tripping hazards, such as rugs, cords, and clutter. Install safety equipment such as grab bars in bathrooms and safety rails on stairs. Keep rooms and walkways   well-lit. General instructions Talk with your health care provider about your risks for falling. Tell your health care provider if: You fall. Be sure to tell your health care provider about all falls, even ones that seem minor. You feel dizzy, tiredness (fatigue), or off-balance. Take over-the-counter and prescription medicines only as told by your health care provider. These include  supplements. Eat a healthy diet and maintain a healthy weight. A healthy diet includes low-fat dairy products, low-fat (lean) meats, and fiber from whole grains, beans, and lots of fruits and vegetables. Stay current with your vaccines. Schedule regular health, dental, and eye exams. Summary Having a healthy lifestyle and getting preventive care can help to protect your health and wellness after age 68. Screening and testing are the best way to find a health problem early and help you avoid having a fall. Early diagnosis and treatment give you the best chance for managing medical conditions that are more common for people who are older than age 68. Falls are a major cause of broken bones and head injuries in people who are older than age 68. Take precautions to prevent a fall at home. Work with your health care provider to learn what changes you can make to improve your health and wellness and to prevent falls. This information is not intended to replace advice given to you by your health care provider. Make sure you discuss any questions you have with your health care provider. Document Revised: 04/09/2021 Document Reviewed: 04/09/2021 Elsevier Patient Education  2023 Elsevier Inc.  

## 2022-10-18 NOTE — Progress Notes (Signed)
Subjective:    Patient ID: Trevor Contreras, male    DOB: May 24, 1956, 66 y.o.   MRN: [id number]  Chief Complaint  Patient presents with   Medical Management of Chronic Issues    HPI PT presents to the office today for CPE. Pt denies any headache, palpitations, SOB, or edema at this time.  Not taking any prescription medications at this time.   Has hx of colon cancer and gets colonoscopy every 5 years.  Review of Systems  All other systems reviewed and are negative.   Family History  Problem Relation Age of Onset   Cancer Mother        breast   Cancer Father        lung   Colon cancer Neg Hx    Colon polyps Neg Hx    Esophageal cancer Neg Hx    Stomach cancer Neg Hx    Rectal cancer Neg Hx    Social History   Socioeconomic History   Marital status: Married    Spouse name: Not on file   Number of children: Not on file   Years of education: Not on file   Highest education level: Not on file  Occupational History   Not on file  Tobacco Use   Smoking status: Never   Smokeless tobacco: Never  Vaping Use   Vaping Use: Never used  Substance and Sexual Activity   Alcohol use: Yes    Comment: 2-3 times weekly, beer   Drug use: No   Sexual activity: Not on file  Other Topics Concern   Not on file  Social History Narrative   Not on file   Social Determinants of Health   Financial Resource Strain: Not on file  Food Insecurity: Not on file  Transportation Needs: Not on file  Physical Activity: Not on file  Stress: Not on file  Social Connections: Not on file       Objective:   Physical Exam Vitals reviewed.  Constitutional:      General: He is not in acute distress.    Appearance: He is well-developed.  HENT:     Head: Normocephalic.     Right Ear: Tympanic membrane normal.     Left Ear: Tympanic membrane normal.  Eyes:     General:        Right eye: No discharge.        Left eye: No discharge.     Pupils: Pupils are equal, round, and reactive to light.   Neck:     Thyroid: No thyromegaly.  Cardiovascular:     Rate and Rhythm: Normal rate and regular rhythm.     Heart sounds: Normal heart sounds. No murmur heard. Pulmonary:     Effort: Pulmonary effort is normal. No respiratory distress.     Breath sounds: Normal breath sounds. No wheezing.  Abdominal:     General: Bowel sounds are normal. There is no distension.     Palpations: Abdomen is soft.     Tenderness: There is no abdominal tenderness.  Musculoskeletal:        General: No tenderness. Normal range of motion.     Cervical back: Normal range of motion and neck supple.  Skin:    General: Skin is warm and dry.     Findings: No erythema or rash.  Neurological:     Mental Status: He is alert and oriented to person, place, and time.     Cranial Nerves: No cranial nerve deficit.  Deep Tendon Reflexes: Reflexes are normal and symmetric.  Psychiatric:        Behavior: Behavior normal.        Thought Content: Thought content normal.        Judgment: Judgment normal.       BP 134/70   Pulse 75   Temp 97.8 F (36.6 C) (Temporal)   Ht _0  (1.854 m)   Wt 215 lb (97.5 kg)   SpO2 96%   BMI 28.37 kg/m      Assessment & Plan:   Torien Ramroop comes in today with chief complaint of Medical Management of Chronic Issues   Diagnosis and orders addressed:  1. Annual physical exam - PSA, total and free - CMP14+EGFR - CBC with Differential/Platelet - Lipid panel - TSH  2. History of colon cancer   Labs pending Health Maintenance reviewed Diet and exercise encouraged  Follow up plan: 1 year    Evelina Dun, FNP

## 2022-10-19 LAB — CBC WITH DIFFERENTIAL/PLATELET
Basophils Absolute: 0.1 10*3/uL (ref 0.0–0.2)
Basos: 2 %
EOS (ABSOLUTE): 0.1 10*3/uL (ref 0.0–0.4)
Eos: 2 %
Hematocrit: 39.2 % (ref 37.5–51.0)
Hemoglobin: 13.2 g/dL (ref 13.0–17.7)
Immature Grans (Abs): 0 10*3/uL (ref 0.0–0.1)
Immature Granulocytes: 0 %
Lymphocytes Absolute: 1.6 10*3/uL (ref 0.7–3.1)
Lymphs: 31 %
MCH: 29.5 pg (ref 26.6–33.0)
MCHC: 33.7 g/dL (ref 31.5–35.7)
MCV: 88 fL (ref 79–97)
Monocytes Absolute: 0.5 10*3/uL (ref 0.1–0.9)
Monocytes: 9 %
Neutrophils Absolute: 2.9 10*3/uL (ref 1.4–7.0)
Neutrophils: 56 %
Platelets: 221 10*3/uL (ref 150–450)
RBC: 4.47 x10E6/uL (ref 4.14–5.80)
RDW: 12.9 % (ref 11.6–15.4)
WBC: 5.1 10*3/uL (ref 3.4–10.8)

## 2022-10-19 LAB — CMP14+EGFR
ALT: 25 IU/L (ref 0–44)
AST: 22 IU/L (ref 0–40)
Albumin/Globulin Ratio: 1.7 (ref 1.2–2.2)
Albumin: 4 g/dL (ref 3.9–4.9)
Alkaline Phosphatase: 81 IU/L (ref 44–121)
BUN/Creatinine Ratio: 24 (ref 10–24)
BUN: 21 mg/dL (ref 8–27)
Bilirubin Total: 0.5 mg/dL (ref 0.0–1.2)
CO2: 22 mmol/L (ref 20–29)
Calcium: 9.4 mg/dL (ref 8.6–10.2)
Chloride: 105 mmol/L (ref 96–106)
Creatinine, Ser: 0.87 mg/dL (ref 0.76–1.27)
Globulin, Total: 2.3 g/dL (ref 1.5–4.5)
Glucose: 105 mg/dL — ABNORMAL HIGH (ref 70–99)
Potassium: 4.2 mmol/L (ref 3.5–5.2)
Sodium: 140 mmol/L (ref 134–144)
Total Protein: 6.3 g/dL (ref 6.0–8.5)
eGFR: 95 mL/min/{1.73_m2} (ref >59)

## 2022-10-19 LAB — TSH: TSH: 2.75 u[IU]/mL (ref 0.450–4.500)

## 2022-10-19 LAB — LIPID PANEL
Chol/HDL Ratio: 2.7 ratio (ref 0.0–5.0)
Cholesterol, Total: 188 mg/dL (ref 100–199)
HDL: 70 mg/dL (ref >39)
LDL Chol Calc (NIH): 101 mg/dL — ABNORMAL HIGH (ref 0–99)
Triglycerides: 96 mg/dL (ref 0–149)
VLDL Cholesterol Cal: 17 mg/dL (ref 5–40)

## 2022-10-19 LAB — PSA, TOTAL AND FREE
PSA, Free Pct: 26.4 %
PSA, Free: 0.29 ng/mL
Prostate Specific Ag, Serum: 1.1 ng/mL (ref 0.0–4.0)

## 2022-10-21 ENCOUNTER — Other Ambulatory Visit: Payer: Self-pay | Admitting: Family

## 2022-10-21 MED ORDER — ROSUVASTATIN CALCIUM 5 MG PO TABS: 5.0000 mg | ORAL_TABLET | Freq: Every day | ORAL | 3 refills | Status: DC

## 2022-12-11 DIAGNOSIS — E1169 Type 2 diabetes mellitus with other specified complication: Secondary | ICD-10-CM | POA: Diagnosis not present

## 2022-12-11 DIAGNOSIS — E1122 Type 2 diabetes mellitus with diabetic chronic kidney disease: Secondary | ICD-10-CM | POA: Diagnosis not present

## 2023-01-11 DIAGNOSIS — Z79899 Other long term (current) drug therapy: Secondary | ICD-10-CM | POA: Diagnosis not present

## 2023-01-11 DIAGNOSIS — M5136 Other intervertebral disc degeneration, lumbar region: Secondary | ICD-10-CM | POA: Diagnosis not present

## 2023-01-11 DIAGNOSIS — M4316 Spondylolisthesis, lumbar region: Secondary | ICD-10-CM | POA: Diagnosis not present

## 2023-01-11 DIAGNOSIS — M47816 Spondylosis without myelopathy or radiculopathy, lumbar region: Secondary | ICD-10-CM | POA: Diagnosis not present

## 2023-01-11 DIAGNOSIS — R29898 Other symptoms and signs involving the musculoskeletal system: Secondary | ICD-10-CM | POA: Diagnosis not present

## 2023-01-11 DIAGNOSIS — Z8616 Personal history of COVID-19: Secondary | ICD-10-CM | POA: Diagnosis not present

## 2023-01-11 DIAGNOSIS — M5459 Other low back pain: Secondary | ICD-10-CM | POA: Diagnosis not present

## 2023-01-11 DIAGNOSIS — R109 Unspecified abdominal pain: Secondary | ICD-10-CM | POA: Diagnosis not present

## 2023-01-11 DIAGNOSIS — Z87442 Personal history of urinary calculi: Secondary | ICD-10-CM | POA: Diagnosis not present

## 2023-01-11 DIAGNOSIS — M545 Low back pain, unspecified: Secondary | ICD-10-CM | POA: Diagnosis not present

## 2023-01-11 DIAGNOSIS — Z85038 Personal history of other malignant neoplasm of large intestine: Secondary | ICD-10-CM | POA: Diagnosis not present

## 2023-02-19 ENCOUNTER — Ambulatory Visit (INDEPENDENT_AMBULATORY_CARE_PROVIDER_SITE_OTHER): Payer: Medicare Other | Admitting: Family Medicine

## 2023-02-19 ENCOUNTER — Encounter: Payer: Self-pay | Admitting: Family Medicine

## 2023-02-19 VITALS — BP 109/69 | HR 58 | Temp 98.3°F | Ht 73.0 in | Wt 212.8 lb

## 2023-02-19 DIAGNOSIS — L723 Sebaceous cyst: Secondary | ICD-10-CM | POA: Diagnosis not present

## 2023-02-19 MED ORDER — AMOXICILLIN-POT CLAVULANATE 875-125 MG PO TABS: 1.0000 | ORAL_TABLET | Freq: Two times a day (BID) | ORAL | 0 refills | Status: DC

## 2023-02-19 NOTE — Progress Notes (Signed)
Chief Complaint  Patient presents with   NON HEALING WOUND    LEFT ARM    HPI  Patient presents today for Lesion left upper arm. Present for an indefinite time as a minor scratch or something similar. Three weeks ago started getting bigger and tender. Last night his wife was able to express pus from it.   PMH: Smoking status noted ROS: Per HPI  Objective: BP 109/69   Pulse (!) 58   Temp 98.3 F (36.8 C)   Ht 6\' 1"  (1.854 m)   Wt 212 lb 12.8 oz (96.5 kg)   SpO2 98%   BMI 28.08 kg/m  Gen: NAD, alert, cooperative with exam HEENT: NCAT Skin: left dorsal upper arm, midshaft area has 1.4 cm indurated area, raised. Mild erythema. No fluctuance.  Neuro: Alert and oriented, No gross deficits  Assessment and plan:  1. Inflamed sebaceous cyst     Meds ordered this encounter  Medications   amoxicillin-clavulanate (AUGMENTIN) 875-125 MG tablet    Sig: Take 1 tablet by mouth 2 (two) times daily. Take all of this medication    Dispense:  20 tablet    Refill:  0    Warm compresses. May need to return for excision. Not enough fluctuance to warrant lancing today, but if pus recollects come here for [place] to lnace it.  Follow up as needed.  Claretta Fraise, MD

## 2023-03-26 DIAGNOSIS — L570 Actinic keratosis: Secondary | ICD-10-CM | POA: Diagnosis not present

## 2023-03-26 DIAGNOSIS — Z85828 Personal history of other malignant neoplasm of skin: Secondary | ICD-10-CM | POA: Diagnosis not present

## 2023-03-26 DIAGNOSIS — D485 Neoplasm of uncertain behavior of skin: Secondary | ICD-10-CM | POA: Diagnosis not present

## 2023-03-26 DIAGNOSIS — Z85820 Personal history of malignant melanoma of skin: Secondary | ICD-10-CM | POA: Diagnosis not present

## 2023-03-26 DIAGNOSIS — L820 Inflamed seborrheic keratosis: Secondary | ICD-10-CM | POA: Diagnosis not present

## 2023-05-08 DIAGNOSIS — M25552 Pain in left hip: Secondary | ICD-10-CM | POA: Diagnosis not present

## 2023-06-23 DIAGNOSIS — U071 COVID-19: Secondary | ICD-10-CM | POA: Diagnosis not present

## 2023-06-23 DIAGNOSIS — R051 Acute cough: Secondary | ICD-10-CM | POA: Diagnosis not present

## 2023-07-07 DIAGNOSIS — B351 Tinea unguium: Secondary | ICD-10-CM | POA: Diagnosis not present

## 2023-07-07 DIAGNOSIS — Z85828 Personal history of other malignant neoplasm of skin: Secondary | ICD-10-CM | POA: Diagnosis not present

## 2023-07-07 DIAGNOSIS — L820 Inflamed seborrheic keratosis: Secondary | ICD-10-CM | POA: Diagnosis not present

## 2023-07-07 DIAGNOSIS — Z85820 Personal history of malignant melanoma of skin: Secondary | ICD-10-CM | POA: Diagnosis not present

## 2023-07-07 DIAGNOSIS — L821 Other seborrheic keratosis: Secondary | ICD-10-CM | POA: Diagnosis not present

## 2023-07-07 DIAGNOSIS — L570 Actinic keratosis: Secondary | ICD-10-CM | POA: Diagnosis not present

## 2023-07-07 DIAGNOSIS — D485 Neoplasm of uncertain behavior of skin: Secondary | ICD-10-CM | POA: Diagnosis not present

## 2023-07-07 DIAGNOSIS — C4441 Basal cell carcinoma of skin of scalp and neck: Secondary | ICD-10-CM | POA: Diagnosis not present

## 2023-07-10 DIAGNOSIS — S9032XA Contusion of left foot, initial encounter: Secondary | ICD-10-CM | POA: Diagnosis not present

## 2023-08-06 DIAGNOSIS — H2511 Age-related nuclear cataract, right eye: Secondary | ICD-10-CM | POA: Diagnosis not present

## 2023-08-06 DIAGNOSIS — H35373 Puckering of macula, bilateral: Secondary | ICD-10-CM | POA: Diagnosis not present

## 2023-08-08 DIAGNOSIS — Z85038 Personal history of other malignant neoplasm of large intestine: Secondary | ICD-10-CM | POA: Diagnosis not present

## 2023-08-08 DIAGNOSIS — Z8616 Personal history of COVID-19: Secondary | ICD-10-CM | POA: Diagnosis not present

## 2023-08-08 DIAGNOSIS — M25552 Pain in left hip: Secondary | ICD-10-CM | POA: Diagnosis not present

## 2023-08-08 DIAGNOSIS — N281 Cyst of kidney, acquired: Secondary | ICD-10-CM | POA: Diagnosis not present

## 2023-08-08 DIAGNOSIS — K6389 Other specified diseases of intestine: Secondary | ICD-10-CM | POA: Diagnosis not present

## 2023-08-08 DIAGNOSIS — N200 Calculus of kidney: Secondary | ICD-10-CM | POA: Diagnosis not present

## 2023-08-08 DIAGNOSIS — I708 Atherosclerosis of other arteries: Secondary | ICD-10-CM | POA: Diagnosis not present

## 2023-08-09 DIAGNOSIS — N200 Calculus of kidney: Secondary | ICD-10-CM | POA: Diagnosis not present

## 2023-08-09 DIAGNOSIS — N281 Cyst of kidney, acquired: Secondary | ICD-10-CM | POA: Diagnosis not present

## 2023-08-09 DIAGNOSIS — I708 Atherosclerosis of other arteries: Secondary | ICD-10-CM | POA: Diagnosis not present

## 2023-08-09 DIAGNOSIS — M25552 Pain in left hip: Secondary | ICD-10-CM | POA: Diagnosis not present

## 2023-08-09 DIAGNOSIS — K6389 Other specified diseases of intestine: Secondary | ICD-10-CM | POA: Diagnosis not present

## 2023-11-09 DIAGNOSIS — R9089 Other abnormal findings on diagnostic imaging of central nervous system: Secondary | ICD-10-CM | POA: Diagnosis not present

## 2023-11-09 DIAGNOSIS — G939 Disorder of brain, unspecified: Secondary | ICD-10-CM | POA: Diagnosis not present

## 2023-11-09 DIAGNOSIS — M1711 Unilateral primary osteoarthritis, right knee: Secondary | ICD-10-CM | POA: Diagnosis not present

## 2023-11-09 DIAGNOSIS — G9340 Encephalopathy, unspecified: Secondary | ICD-10-CM | POA: Diagnosis not present

## 2023-11-09 DIAGNOSIS — Z8616 Personal history of COVID-19: Secondary | ICD-10-CM | POA: Diagnosis not present

## 2023-11-09 DIAGNOSIS — H538 Other visual disturbances: Secondary | ICD-10-CM | POA: Diagnosis not present

## 2023-11-09 DIAGNOSIS — N200 Calculus of kidney: Secondary | ICD-10-CM | POA: Diagnosis not present

## 2023-11-09 DIAGNOSIS — D496 Neoplasm of unspecified behavior of brain: Secondary | ICD-10-CM | POA: Diagnosis not present

## 2023-11-09 DIAGNOSIS — Z85038 Personal history of other malignant neoplasm of large intestine: Secondary | ICD-10-CM | POA: Diagnosis not present

## 2023-11-09 DIAGNOSIS — C719 Malignant neoplasm of brain, unspecified: Secondary | ICD-10-CM | POA: Diagnosis not present

## 2023-11-09 DIAGNOSIS — Z85828 Personal history of other malignant neoplasm of skin: Secondary | ICD-10-CM | POA: Diagnosis not present

## 2023-11-09 DIAGNOSIS — E8729 Other acidosis: Secondary | ICD-10-CM | POA: Diagnosis not present

## 2023-11-09 DIAGNOSIS — Z1152 Encounter for screening for COVID-19: Secondary | ICD-10-CM | POA: Diagnosis not present

## 2023-11-09 DIAGNOSIS — G319 Degenerative disease of nervous system, unspecified: Secondary | ICD-10-CM | POA: Diagnosis not present

## 2023-11-09 DIAGNOSIS — G4489 Other headache syndrome: Secondary | ICD-10-CM | POA: Diagnosis not present

## 2023-11-09 DIAGNOSIS — D649 Anemia, unspecified: Secondary | ICD-10-CM | POA: Diagnosis not present

## 2023-11-09 DIAGNOSIS — N281 Cyst of kidney, acquired: Secondary | ICD-10-CM | POA: Diagnosis not present

## 2023-11-09 DIAGNOSIS — R9402 Abnormal brain scan: Secondary | ICD-10-CM | POA: Diagnosis not present

## 2023-11-09 DIAGNOSIS — C710 Malignant neoplasm of cerebrum, except lobes and ventricles: Secondary | ICD-10-CM | POA: Diagnosis not present

## 2023-11-09 DIAGNOSIS — R29818 Other symptoms and signs involving the nervous system: Secondary | ICD-10-CM | POA: Diagnosis not present

## 2023-11-09 DIAGNOSIS — R569 Unspecified convulsions: Secondary | ICD-10-CM | POA: Diagnosis not present

## 2023-11-09 DIAGNOSIS — R0602 Shortness of breath: Secondary | ICD-10-CM | POA: Diagnosis not present

## 2023-11-09 DIAGNOSIS — Z85820 Personal history of malignant melanoma of skin: Secondary | ICD-10-CM | POA: Diagnosis not present

## 2023-11-09 DIAGNOSIS — Z9049 Acquired absence of other specified parts of digestive tract: Secondary | ICD-10-CM | POA: Diagnosis not present

## 2023-11-09 DIAGNOSIS — R9082 White matter disease, unspecified: Secondary | ICD-10-CM | POA: Diagnosis not present

## 2023-11-09 DIAGNOSIS — R930 Abnormal findings on diagnostic imaging of skull and head, not elsewhere classified: Secondary | ICD-10-CM | POA: Diagnosis not present

## 2023-11-09 DIAGNOSIS — Z79899 Other long term (current) drug therapy: Secondary | ICD-10-CM | POA: Diagnosis not present

## 2023-11-09 DIAGNOSIS — R1013 Epigastric pain: Secondary | ICD-10-CM | POA: Diagnosis not present

## 2023-11-14 ENCOUNTER — Telehealth: Payer: Self-pay | Admitting: *Deleted

## 2023-11-14 ENCOUNTER — Encounter: Payer: Self-pay | Admitting: *Deleted

## 2023-11-14 NOTE — Transitions of Care (Post Inpatient/ED Visit) (Signed)
   11/14/2023  Name: Trevor Contreras MRN: [id number] DOB: 10-08-1956  Today's TOC FU Call Status: Today's TOC FU Call Status:: Unsuccessful Call (1st Attempt) Unsuccessful Call (1st Attempt) Date: 11/14/23  Attempted to reach the patient regarding the most recent Inpatient/ED visit.  Follow Up Plan: Additional outreach attempts will be made to reach the patient to complete the Transitions of Care (Post Inpatient/ED visit) call.   Irving Shows [facility], BSN RN Care Manager/ Transition of Care [place]/ [facility] [phone]

## 2023-11-14 NOTE — Transitions of Care (Post Inpatient/ED Visit) (Signed)
11/14/2023  Name: Trevor Contreras MRN: [id number] DOB: 12-Jul-1956  Today's TOC FU Call Status: Today's TOC FU Call Status:: Successful TOC FU Call Completed TOC FU Call Complete Date: 11/14/23 Patient's Name and Date of Birth confirmed.  Transition Care Management Follow-up Telephone Call Date of Discharge: 11/13/23 Discharge Facility: Other [facility]) Name of Other (Non[facility]) Discharge Facility: [facility] Type of Discharge: Inpatient Admission Primary Inpatient Discharge Diagnosis:: Seizure How have you been since you were released from the hospital?: Better (pt states "feeling better, I"ve been through alot") Any questions or concerns?:  (pt is eating well, bowels moving without difficulty, up walking around)  Items Reviewed: Did you receive and understand the discharge instructions provided?: Yes Medications obtained,verified, and reconciled?: Yes (Medications Reviewed) Any new allergies since your discharge?: Yes Dietary orders reviewed?: Yes Type of Diet Ordered:: heart healthy Do you have support at home?: Yes People in Home: spouse Name of Support/Comfort Primary Source: Trevor Contreras Patient / spouse declined enrollment in 30 day program Spouse states pt has brain lesion and next steps are determining primary site of cancer as pt has history of colon cancer and melanoma, will be following up with multiple specialists.  Medications Reviewed Today: Medications Reviewed Today     Reviewed by Audrie Gallus, RN (Registered Nurse) on 11/14/23 at 1140  Med List Status: <None>   Medication Order Taking? Sig Documenting Provider Last Dose Status Informant  amoxicillin-clavulanate (AUGMENTIN) 875-125 MG tablet [id number]  Take 1 tablet by mouth 2 (two) times daily. Take all of this medication Mechele Claude, MD  Consider Medication Status and Discontinue (Completed Course)   dexamethasone (DECADRON) 4 MG tablet [id number] Yes Take 4 mg by  mouth 2 (two) times daily. Take with meals x 30 days [provider] Taking Active   diclofenac (VOLTAREN) 75 MG EC tablet [id number] Yes Take 75 mg by mouth as needed. [provider] Taking Active   lacosamide (VIMPAT) 200 MG TABS tablet [id number] Yes Take 200 mg by mouth 2 (two) times daily. [provider] Taking Active   levETIRAcetam (KEPPRA) 1000 MG tablet [id number] Yes Take 1,000 mg by mouth 2 (two) times daily. Take for 30 days [provider] Taking Active             Home Care and Equipment/Supplies: Were Home Health Services Ordered?: No Any new equipment or medical supplies ordered?: No  Functional Questionnaire: Do you need assistance with bathing/showering or dressing?: No Do you need assistance with meal preparation?: Yes (spouse assists) Do you need assistance with eating?: No Do you have difficulty maintaining continence: No Do you need assistance with getting out of bed/getting out of a chair/moving?: No Do you have difficulty managing or taking your medications?: Yes (spouse assists)  Follow up appointments reviewed: PCP Follow-up appointment confirmed?: No (spouse will make PCP appt, feels specialist appts take precedence) MD Provider Line [phone] Given: No Specialist Hospital Follow-up appointment confirmed?: Yes Date of Specialist follow-up appointment?: 11/20/23 Follow-Up Specialty Provider:: Vernie Murders DNP  [facility] @ 11 am,    Oncology [facility]  11/17/23 @ 10 am  [facility] Do you need transportation to your follow-up appointment?: No Do you understand care options if your condition(s) worsen?: Yes-patient verbalized understanding  SDOH Interventions Today    Flowsheet Row Most Recent Value  SDOH Interventions   Food Insecurity Interventions Intervention Not Indicated  Housing Interventions Intervention Not Indicated  Transportation Interventions  Intervention Not Indicated  Utilities Interventions Intervention Not Indicated       Irving Shows [facility], BSN RN Care Manager/ Transition of Care [place]/ [facility] [phone]

## 2023-11-17 DIAGNOSIS — D432 Neoplasm of uncertain behavior of brain, unspecified: Secondary | ICD-10-CM | POA: Diagnosis not present

## 2023-11-20 DIAGNOSIS — C719 Malignant neoplasm of brain, unspecified: Secondary | ICD-10-CM | POA: Diagnosis not present

## 2023-11-24 DIAGNOSIS — G9389 Other specified disorders of brain: Secondary | ICD-10-CM | POA: Diagnosis not present

## 2023-11-24 DIAGNOSIS — D496 Neoplasm of unspecified behavior of brain: Secondary | ICD-10-CM | POA: Diagnosis not present

## 2023-11-24 DIAGNOSIS — Z9889 Other specified postprocedural states: Secondary | ICD-10-CM | POA: Diagnosis not present

## 2023-11-24 DIAGNOSIS — K2960 Other gastritis without bleeding: Secondary | ICD-10-CM | POA: Diagnosis not present

## 2023-11-24 DIAGNOSIS — Z79899 Other long term (current) drug therapy: Secondary | ICD-10-CM | POA: Diagnosis not present

## 2023-11-24 DIAGNOSIS — Z01818 Encounter for other preprocedural examination: Secondary | ICD-10-CM | POA: Diagnosis not present

## 2023-11-24 DIAGNOSIS — R9389 Abnormal findings on diagnostic imaging of other specified body structures: Secondary | ICD-10-CM | POA: Diagnosis not present

## 2023-11-24 DIAGNOSIS — Z85820 Personal history of malignant melanoma of skin: Secondary | ICD-10-CM | POA: Diagnosis not present

## 2023-11-24 DIAGNOSIS — C719 Malignant neoplasm of brain, unspecified: Secondary | ICD-10-CM | POA: Diagnosis not present

## 2023-11-24 DIAGNOSIS — C712 Malignant neoplasm of temporal lobe: Secondary | ICD-10-CM | POA: Diagnosis not present

## 2023-11-24 DIAGNOSIS — T380X5A Adverse effect of glucocorticoids and synthetic analogues, initial encounter: Secondary | ICD-10-CM | POA: Diagnosis not present

## 2023-11-24 DIAGNOSIS — C711 Malignant neoplasm of frontal lobe: Secondary | ICD-10-CM | POA: Diagnosis not present

## 2023-11-24 DIAGNOSIS — Z85828 Personal history of other malignant neoplasm of skin: Secondary | ICD-10-CM | POA: Diagnosis not present

## 2023-11-24 DIAGNOSIS — Z85038 Personal history of other malignant neoplasm of large intestine: Secondary | ICD-10-CM | POA: Diagnosis not present

## 2023-11-24 DIAGNOSIS — Z9049 Acquired absence of other specified parts of digestive tract: Secondary | ICD-10-CM | POA: Diagnosis not present

## 2023-11-27 DIAGNOSIS — Z9889 Other specified postprocedural states: Secondary | ICD-10-CM | POA: Diagnosis not present

## 2023-11-27 DIAGNOSIS — G9389 Other specified disorders of brain: Secondary | ICD-10-CM | POA: Diagnosis not present

## 2023-11-27 DIAGNOSIS — C712 Malignant neoplasm of temporal lobe: Secondary | ICD-10-CM | POA: Diagnosis not present

## 2023-12-04 DIAGNOSIS — C719 Malignant neoplasm of brain, unspecified: Secondary | ICD-10-CM | POA: Diagnosis not present

## 2023-12-04 DIAGNOSIS — D496 Neoplasm of unspecified behavior of brain: Secondary | ICD-10-CM | POA: Diagnosis not present

## 2023-12-08 ENCOUNTER — Other Ambulatory Visit: Payer: Self-pay | Admitting: Radiation Therapy

## 2023-12-08 DIAGNOSIS — Z85828 Personal history of other malignant neoplasm of skin: Secondary | ICD-10-CM | POA: Diagnosis not present

## 2023-12-08 DIAGNOSIS — C719 Malignant neoplasm of brain, unspecified: Secondary | ICD-10-CM

## 2023-12-08 DIAGNOSIS — L570 Actinic keratosis: Secondary | ICD-10-CM | POA: Diagnosis not present

## 2023-12-08 DIAGNOSIS — L820 Inflamed seborrheic keratosis: Secondary | ICD-10-CM | POA: Diagnosis not present

## 2023-12-08 DIAGNOSIS — Z85820 Personal history of malignant melanoma of skin: Secondary | ICD-10-CM | POA: Diagnosis not present

## 2023-12-08 DIAGNOSIS — D485 Neoplasm of uncertain behavior of skin: Secondary | ICD-10-CM | POA: Diagnosis not present

## 2023-12-08 DIAGNOSIS — L821 Other seborrheic keratosis: Secondary | ICD-10-CM | POA: Diagnosis not present

## 2023-12-10 DIAGNOSIS — D496 Neoplasm of unspecified behavior of brain: Secondary | ICD-10-CM | POA: Diagnosis not present

## 2023-12-10 DIAGNOSIS — Z803 Family history of malignant neoplasm of breast: Secondary | ICD-10-CM | POA: Diagnosis not present

## 2023-12-10 DIAGNOSIS — Z85038 Personal history of other malignant neoplasm of large intestine: Secondary | ICD-10-CM | POA: Diagnosis not present

## 2023-12-10 DIAGNOSIS — Z85820 Personal history of malignant melanoma of skin: Secondary | ICD-10-CM | POA: Diagnosis not present

## 2023-12-15 DIAGNOSIS — C719 Malignant neoplasm of brain, unspecified: Secondary | ICD-10-CM | POA: Insufficient documentation

## 2023-12-15 DIAGNOSIS — C712 Malignant neoplasm of temporal lobe: Secondary | ICD-10-CM | POA: Insufficient documentation

## 2023-12-15 DIAGNOSIS — C714 Malignant neoplasm of occipital lobe: Secondary | ICD-10-CM | POA: Insufficient documentation

## 2023-12-15 NOTE — Progress Notes (Signed)
 Radiation Oncology         [phone]) [phone] ________________________________  Name: Trevor Contreras        MRN: [id number]  Date of Service: 12/17/2023 DOB: [birth year]  AO:ZHYQM, Libbie Redwood, FNP  Vaslow, Zachary K, MD     REFERRING PHYSICIAN: Vaslow, Zachary K, MD   DIAGNOSIS: The encounter diagnosis was Glioblastoma multiforme of occipital lobe (HCC).   HISTORY OF PRESENT ILLNESS: Trevor Contreras is a 68 y.o. male seen at the request of Dr. Mark Sil for a newly diagnosed glioblastoma. The patient presented to [facility] ER for having flashing lights in his vision on 11/09/23.  Imaging of the brain included CT for ruling out stroke on 11/09/2023 and no acute intracranial abnormality was appreciated, he did have an MRI of the head as well without and with contrast which showed small foci of signal abnormality posterior to the right temporal lobe suspicious for an infiltrative primary CNS neoplasm with mild restricted diffusion and nodular enhancement, this area measured up to 2.9 cm in greatest dimension.  He underwent a stereotactic brain biopsy at [facility] on 11/12/2023 and was felt to be consistent with high-grade glioma.  The patient was seen at [facility] in the brain tumor clinic as a self-referral on 11/17/2023 was taken to the operating room on 11/27/2023 where he underwent craniotomy with surgical resection with Dr. Reather Campbell.  Grade 4 IDH wild-type glioblastoma was noted, and postoperative MRI of the brain with and without contrast showed surgical changes following resection of the right temporal occipital mass with slight interval increase in T2 hyperintense signal but no remaining nodular or masslike enhancement.  It was felt that he had a gross total resection.  He met with Dr. Mark Sil yesterday to discuss chemoradiation and is seen to discuss the radiation component.    PREVIOUS RADIATION THERAPY: No   PAST MEDICAL HISTORY:  Past Medical History:  Diagnosis Date   Colon cancer (HCC) 2002       PAST  SURGICAL HISTORY: Past Surgical History:  Procedure Laterality Date   COLON SURGERY     COLONOSCOPY     EYE SURGERY     trying to correct blurred vision in left eye; unsuccessful   KNEE ARTHROSCOPY     bilat; several surgeries   SHOULDER ARTHROSCOPY     rotator cuff, reattach part of long bicep, Dr Audery Lean     FAMILY HISTORY:  Family History  Problem Relation Age of Onset   Cancer Mother        breast   Cancer Father        lung   Colon cancer Neg Hx    Colon polyps Neg Hx    Esophageal cancer Neg Hx    Stomach cancer Neg Hx    Rectal cancer Neg Hx      SOCIAL HISTORY:  reports that he has never smoked. He has never used smokeless tobacco. He reports current alcohol use. He reports that he does not use drugs.  The patient is married and lives in [place] [facility] [place] [facility].   ALLERGIES: Patient has no known allergies.   MEDICATIONS:  Current Outpatient Medications  Medication Sig Dispense Refill   gabapentin (NEURONTIN) 300 MG capsule Take 300 mg by mouth at bedtime.     lacosamide  (VIMPAT ) 200 MG TABS tablet Take 200 mg by mouth 2 (two) times daily.     levETIRAcetam (KEPPRA) 1000 MG tablet Take 1,000 mg by mouth 2 (two) times daily. Take for 30 days     Melatonin 10  MG CAPS Take 10 mg by mouth as needed (prn at bedtime for sleep).     Multiple Vitamins-Minerals (CENTRUM ADULT PO) Take 1 tablet by mouth daily.     [START ON 12/29/2023] ondansetron  (ZOFRAN ) 8 MG tablet Take 1 tablet (8 mg total) by mouth every 8 (eight) hours as needed for nausea or vomiting. May take 30-60 minutes prior to Temodar  administration if nausea/vomiting occurs as needed. 30 tablet 1   senna-docusate (SENOKOT-S) 8.6-50 MG tablet Take 1 tablet by mouth 2 (two) times daily.     diclofenac (VOLTAREN) 75 MG EC tablet Take 75 mg by mouth as needed. (Patient not taking: Reported on 12/17/2023)     methocarbamol (ROBAXIN) 500 MG tablet Take 500 mg by mouth 3 (three) times daily. (Patient not taking: Reported on  12/17/2023)     ondansetron  (ZOFRAN -ODT) 4 MG disintegrating tablet Take 4 mg by mouth every 8 (eight) hours as needed. (Patient not taking: Reported on 12/17/2023)     [START ON 12/29/2023] temozolomide  (TEMODAR ) 140 MG capsule Take 1 capsule (140 mg total) by mouth daily. (Take with one 20 mg capsule for total daily dose of 160 mg). May take on an empty stomach to decrease nausea & vomiting. 42 capsule 0   [START ON 12/29/2023] temozolomide  (TEMODAR ) 20 MG capsule Take 1 capsule (20 mg total) by mouth daily. (Take with one 140 mg capsule for total daily dose of 160 mg). May take on an empty stomach to decrease nausea & vomiting. 42 capsule 0   No current facility-administered medications for this encounter.     REVIEW OF SYSTEMS: A complete review of systems was reviewed with the patient and pertinent positives findings are noted in the history of present illness.     PHYSICAL EXAM:  Wt Readings from Last 3 Encounters:  12/17/23 220 lb 3.2 oz (99.9 kg)  12/16/23 218 lb 11.2 oz (99.2 kg)  02/19/23 212 lb 12.8 oz (96.5 kg)   Temp Readings from Last 3 Encounters:  12/17/23 97.7 F (36.5 C)  12/16/23 (!) 97.3 F (36.3 C) (Temporal)  02/19/23 98.3 F (36.8 C)   BP Readings from Last 3 Encounters:  12/17/23 (!) 141/90  12/16/23 125/74  02/19/23 109/69   Pulse Readings from Last 3 Encounters:  12/17/23 67  12/16/23 72  02/19/23 (!) 58   Pain Assessment Pain Score: 0-No pain/10  In general this is a well appearing Caucasian male in no acute distress. He's alert and oriented x4 and appropriate throughout the examination. Cardiopulmonary assessment is negative for acute distress and he exhibits normal effort.     ECOG = 1  0 - Asymptomatic (Fully active, able to carry on all predisease activities without restriction)  1 - Symptomatic but completely ambulatory (Restricted in physically strenuous activity but ambulatory and able to carry out work of a light or sedentary nature. For  example, light housework, office work)  2 - Symptomatic, <50% in bed during the day (Ambulatory and capable of all self care but unable to carry out any work activities. Up and about more than 50% of waking hours)  3 - Symptomatic, >50% in bed, but not bedbound (Capable of only limited self-care, confined to bed or chair 50% or more of waking hours)  4 - Bedbound (Completely disabled. Cannot carry on any self-care. Totally confined to bed or chair)  5 - Death   Aurea Blossom MM, Creech RH, Tormey DC, et al. [phone]). "Toxicity and response criteria of the [facility] [facility] [employer]".  Am. [employer]. Oncol. 5 (6): 649-55    LABORATORY DATA:  Lab Results  Component Value Date   WBC 5.1 10/18/2022   HGB 13.2 10/18/2022   HCT 39.2 10/18/2022   MCV 88 10/18/2022   PLT 221 10/18/2022   Lab Results  Component Value Date   NA 140 10/18/2022   K 4.2 10/18/2022   CL 105 10/18/2022   CO2 22 10/18/2022   Lab Results  Component Value Date   ALT 25 10/18/2022   AST 22 10/18/2022   ALKPHOS 81 10/18/2022   BILITOT 0.5 10/18/2022      RADIOGRAPHY: No results found.     IMPRESSION/PLAN: 1. Glioblastoma of the right temporal/occipital region.  Today we discussed the patient's recent pathology and imaging tests and the nature of primary brain malignancy.  He has been healing well and has met with Dr. Mark Sil has chemoradiation to reduce risks of local recurrence.  Today we discussed the radiation component of this treatment.  We discussed the risks, benefits, short, and long term effects of radiotherapy, as well as the curative intent, and the patient is interested in proceeding.  I reviewed the delivery and logistics of radiotherapy and anticipates a course of 6 weeks of radiotherapy to the surgical resection cavity. Written consent is obtained and placed in the chart, a copy was provided to the patient. He will simulate on later this week and is expected to begin treatment on 12/28/22.   In a  visit lasting 50 minutes, greater than 50% of the time was spent face to face discussing the patient's condition, in preparation for the discussion, and coordinating the patient's care.   ------------------------------------------------  Alix Isaac, MD, PhD    **Disclaimer: This note was dictated with voice recognition software. Similar sounding words can inadvertently be transcribed and this note may contain transcription errors which may not have been corrected upon publication of note.**

## 2023-12-16 ENCOUNTER — Ambulatory Visit
Admission: RE | Admit: 2023-12-16 | Discharge: 2023-12-16 | Disposition: A | Discharge status: Home / Self Care | Payer: Self-pay | Source: Ambulatory Visit | Attending: Internal Medicine | Admitting: Internal Medicine

## 2023-12-16 ENCOUNTER — Telehealth: Payer: Self-pay | Admitting: *Deleted

## 2023-12-16 ENCOUNTER — Encounter: Payer: Self-pay | Admitting: Internal Medicine

## 2023-12-16 ENCOUNTER — Other Ambulatory Visit (HOSPITAL_COMMUNITY): Payer: Self-pay

## 2023-12-16 ENCOUNTER — Inpatient Hospital Stay: Payer: Medicare Other | Attending: Internal Medicine | Admitting: Internal Medicine

## 2023-12-16 ENCOUNTER — Other Ambulatory Visit: Payer: Self-pay | Admitting: *Deleted

## 2023-12-16 VITALS — BP 125/74 | HR 72 | Temp 97.3°F | Resp 16 | Wt 218.7 lb

## 2023-12-16 DIAGNOSIS — Z85038 Personal history of other malignant neoplasm of large intestine: Secondary | ICD-10-CM | POA: Diagnosis not present

## 2023-12-16 DIAGNOSIS — C712 Malignant neoplasm of temporal lobe: Secondary | ICD-10-CM | POA: Diagnosis not present

## 2023-12-16 DIAGNOSIS — C719 Malignant neoplasm of brain, unspecified: Secondary | ICD-10-CM

## 2023-12-16 MED ORDER — TEMOZOLOMIDE 20 MG PO CAPS
20.0000 mg | ORAL_CAPSULE | Freq: Every day | ORAL | 0 refills | Status: DC
Start: 2023-12-29 — End: 2023-12-17

## 2023-12-16 MED ORDER — TEMOZOLOMIDE 140 MG PO CAPS: 140.0000 mg | ORAL_CAPSULE | Freq: Every day | ORAL | 0 refills | Status: DC

## 2023-12-16 MED ORDER — ONDANSETRON HCL 8 MG PO TABS
8.0000 mg | ORAL_TABLET | Freq: Three times a day (TID) | ORAL | 1 refills | Status: DC | PRN
  Filled 2023-12-16: qty 30, 10d supply, fill #0

## 2023-12-16 NOTE — Progress Notes (Signed)
 START ON PATHWAY REGIMEN - Neuro     One cycle, concurrent with RT:     Temozolomide    **Always confirm dose/schedule in your pharmacy ordering system**  Patient Characteristics: Glioma, Glioblastoma, IDH-wildtype, Newly Diagnosed or Treatment Naive, Good Performance Status and/or Younger Patient, MGMT Promoter Unmethylated/Unknown Disease Classification: Glioma Disease Classification: Glioblastoma, IDH-wildtype Disease Status: Newly Diagnosed or Treatment Naive Performance Status: Good Performance Status and/or Younger Patient MGMT Promoter Methylation Status: Unmethylated Intent of Therapy: Non-Curative / Palliative Intent, Discussed with Patient

## 2023-12-16 NOTE — Telephone Encounter (Signed)
 Requested that [facility] push images from brain MRI done on 11/28/23 to [facility], patient has appointment here today.  Spoke with Genelle Bal at [facility].  Patient demographic sheet faxed to [phone].  Fax confirmation received.

## 2023-12-16 NOTE — Progress Notes (Addendum)
 Location/Histology of Brain Tumor: Glioblastoma-Temporal/Occipital Lobe  MRI Brain 11/28/2023  Craniotomy 11/27/2023    Past or anticipated interventions, if any, per neurosurgery:  Dr. Nellie Banas -Craniotomy with surgical resection 11/27/2023   Past or anticipated interventions, if any, per medical oncology:  Dr. Mark Sil 12/16/2023 -We ultimately recommended proceeding with course of intensity modulated radiation therapy and concurrent daily Temozolomide . Radiation will be administered Mon-Fri over 6 weeks, Temodar  will be dosed at 75mg /m2 to be given daily over 42 days.   Dose of Decadron , if applicable:   Recent neurologic symptoms, if any:  Seizures: No Headaches: Mild headaches daily. Nausea: No Dizziness/ataxia: No Difficulty with hand coordination: No Focal numbness/weakness: No Gait: He reports occasional wobbly gait. Visual deficits/changes: Blurred vision in the left eye.   Confusion/Memory deficits: He reports occasional fogginess, forgetting where something goes in his house. Speech: Clear    SAFETY ISSUES: Prior radiation? No Pacemaker/ICD? No Possible current pregnancy? N/a Is the patient on methotrexate? No  Additional Complaints / other details:

## 2023-12-16 NOTE — Progress Notes (Addendum)
 [facility] [address]  [place], [place] [address] [phone]   New Patient Evaluation  Date of Service: 12/16/23 Patient Name: Trevor Contreras Patient MRN: [id number] Patient DOB: May 19, 1956 Provider: Arthea MARLA Manns, MD  Identifying Statement:  Trevor Contreras is a 68 y.o. male with right occipital glioblastoma who presents for initial consultation and evaluation.    Referring Provider: Lavell Bari LABOR, FNP [address] [place],  [place] [address]  Oncologic History: Oncology History  Glioblastoma, IDH-wildtype [facility])  11/27/2023 Surgery   Craniotomy, resection of right occipital mass with Dr. Saturnino; path is glioblastoma, IDHwt     Biomarkers:  MGMT Unmethylated.  IDH 1/2 Wild type.  EGFR Unknown  TERT Unknown   History of Present Illness: The patient's records from the referring physician were obtained and reviewed and the patient interviewed to confirm this HPI.  Trevor Contreras presented to neurologic attention on 11/09/23 with new onset seizures, characterized as flashing lights in the left peripheral vision. CNS imaging demonstrated enhancing mass in the right occipital lobe, c/w primary tumor.  He underwent craniotomy, resection with Dr. Saturnino at [facility] on 11/27/23.  Following surgery, he has had few issues, mostly normal vision.  Is independent with activities of daily living, no further seizures.  Medications: Current Outpatient Medications on File Prior to Visit  Medication Sig Dispense Refill   amoxicillin -clavulanate (AUGMENTIN ) 875-125 MG tablet Take 1 tablet by mouth 2 (two) times daily. Take all of this medication 20 tablet 0   dexamethasone  (DECADRON ) 4 MG tablet Take 4 mg by mouth 2 (two) times daily. Take with meals x 30 days     diclofenac (VOLTAREN) 75 MG EC tablet Take 75 mg by mouth as needed.     lacosamide  (VIMPAT ) 200 MG TABS tablet Take 200 mg by mouth 2 (two) times daily.     levETIRAcetam (KEPPRA) 1000  MG tablet Take 1,000 mg by mouth 2 (two) times daily. Take for 30 days     No current facility-administered medications on file prior to visit.    Allergies: No Known Allergies Past Medical History:  Past Medical History:  Diagnosis Date   Colon cancer (HCC) 2002   Past Surgical History:  Past Surgical History:  Procedure Laterality Date   COLON SURGERY     COLONOSCOPY     EYE SURGERY     trying to correct blurred vision in left eye; unsuccessful   KNEE ARTHROSCOPY     bilat; several surgeries   SHOULDER ARTHROSCOPY     rotator cuff, reattach part of long bicep, Dr Levorn   Social History:  Social History   Socioeconomic History   Marital status: Married    Spouse name: Not on file   Number of children: Not on file   Years of education: Not on file   Highest education level: Not on file  Occupational History   Not on file  Tobacco Use   Smoking status: Never   Smokeless tobacco: Never  Vaping Use   Vaping status: Never Used  Substance and Sexual Activity   Alcohol use: Yes    Comment: 2-3 times weekly, beer   Drug use: No   Sexual activity: Not on file  Other Topics Concern   Not on file  Social History Narrative   Not on file   Social Drivers of Health   Financial Resource Strain: Low Risk  (11/22/2023)   Received from [facility]   Overall Financial  Resource Strain (CARDIA)    Difficulty of Paying Living Expenses: Not hard at all  Food Insecurity: No Food Insecurity (11/22/2023)   Received from [facility]   Hunger Vital Sign    Worried About Running Out of Food in the Last Year: Never true    Ran Out of Food in the Last Year: Never true  Transportation Needs: No Transportation Needs (11/22/2023)   Received from [facility] - Transportation    In the past 12 months, has lack of transportation kept you from medical appointments or from getting medications?: No    Lack of Transportation  (Non-Medical): No  Physical Activity: Sufficiently Active (11/17/2023)   Received from [facility]   Exercise Vital Sign    Days of Exercise per Week: 5 days    Minutes of Exercise per Session: 60 min  Stress: Stress Concern Present (11/17/2023)   Received from [facility] - Occupational Stress Questionnaire    Feeling of Stress : Very much  Social Connections: Socially Integrated (11/17/2023)   Received from [facility]   Social Network    How would you rate your social network (family, work, friends)?: Good participation with social networks  Intimate Partner Violence: Not At Risk (11/17/2023)   Received from [facility]   HITS    Over the last 12 months how often did your partner physically hurt you?: Never    Over the last 12 months how often did your partner insult you or talk down to you?: Never    Over the last 12 months how often did your partner threaten you with physical harm?: Never    Over the last 12 months how often did your partner scream or curse at you?: Never   Family History:  Family History  Problem Relation Age of Onset   Cancer Mother        breast   Cancer Father        lung   Colon cancer Neg Hx    Colon polyps Neg Hx    Esophageal cancer Neg Hx    Stomach cancer Neg Hx    Rectal cancer Neg Hx     Review of Systems: Constitutional: Doesn't report fevers, chills or abnormal weight loss Eyes: Doesn't report blurriness of vision Ears, nose, mouth, throat, and face: Doesn't report sore throat Respiratory: Doesn't report cough, dyspnea or wheezes Cardiovascular: Doesn't report palpitation, chest discomfort  Gastrointestinal:  Doesn't report nausea, constipation, diarrhea GU: Doesn't report incontinence Skin: Doesn't report skin rashes Neurological: Per HPI Musculoskeletal: Doesn't report joint pain Behavioral/Psych: Doesn't report anxiety  Physical Exam: Vitals:   12/16/23 1502  BP: 125/74   Pulse: 72  Resp: 16  Temp: (!) 97.3 F (36.3 C)  SpO2: 98%   KPS: 80. General: Alert, cooperative, pleasant, in no acute distress Head: Normal EENT: No conjunctival injection or scleral icterus.  Lungs: Resp effort normal Cardiac: Regular rate Abdomen: Non-distended abdomen Skin: No rashes cyanosis or petechiae. Extremities: No clubbing or edema  Neurologic Exam: Mental Status: Awake, alert, attentive to examiner. Oriented to self and environment. Language is fluent with intact comprehension.  Cranial Nerves: Visual acuity is grossly normal. Visual fields are full. Extra-ocular movements intact. No ptosis. Face is symmetric Motor: Tone and bulk are normal. Power is full in both arms and legs. Reflexes are symmetric, no pathologic reflexes present.  Sensory: Intact to light touch Gait: Normal.   Labs: I  have reviewed the data as listed    Component Value Date/Time   NA 140 10/18/2022 0949   K 4.2 10/18/2022 0949   CL 105 10/18/2022 0949   CO2 22 10/18/2022 0949   GLUCOSE 105 (H) 10/18/2022 0949   GLUCOSE 86 05/31/2013 1310   BUN 21 10/18/2022 0949   CREATININE 0.87 10/18/2022 0949   CREATININE 0.91 05/31/2013 1310   CALCIUM  9.4 10/18/2022 0949   PROT 6.3 10/18/2022 0949   ALBUMIN 4.0 10/18/2022 0949   AST 22 10/18/2022 0949   ALT 25 10/18/2022 0949   ALKPHOS 81 10/18/2022 0949   BILITOT 0.5 10/18/2022 0949   GFRNONAA 76 06/02/2020 1444   GFRNONAA >89 05/31/2013 1310   GFRAA 88 06/02/2020 1444   GFRAA >89 05/31/2013 1310   Lab Results  Component Value Date   WBC 5.1 10/18/2022   NEUTROABS 2.9 10/18/2022   HGB 13.2 10/18/2022   HCT 39.2 10/18/2022   MCV 88 10/18/2022   PLT 221 10/18/2022    Imaging:  Pathology: Case Report Surgical Pathology                                Case: [id number]                               Authorizing Provider:  Saturnino Peggye Barrio, MD Collected:           11/27/2023 0955             Ordering Location:     [facility]  [place]    Received:            11/27/2023 1007                                    Periop                                                                     Pathologist:           Joshua Orlin Dragon, MD                                                       Intraop:               Tommas Debby Rush, MD                                                   Specimens:   A) - Brain, brain tissue  B) - Brain, brain tissue                                                              DIAGNOSIS     A,B. Brain, right temporal lesion, resection:   Integrated diagnosis: Glioblastoma, IDH-wildtype, CNS WHO grade 4. See comment.    Molecular information: - IDH1: NEGATIVE for p.R132H mutation (IHC). - ATRX: RETAINED expression (IHC).   Comment: IDH-wildtype status in this histologic glioblastoma is based on negative immunostaining for the most common IDH mutation (IDH1 p.R132H).  Less common IDH1 or IDH2 mutations have not been entirely ruled out; however, they are very rare in patients over the age of 32 and ATRX retained expression further supports the diagnosis.  Molecular testing, including MGMT promoter methylation, could be pursued if indicated.    Electronically signed by Joshua Orlin Dragon, MD on 12/02/2023 at  2:29 PM Preliminary result electronically signed by Joshua Orlin Dragon, MD on 12/01/2023 at  9:42 AM  Clinical Information   68 year old man with history of colon cancer, melanoma, and basal cell carcinoma who presented to an outside hospital with new onset generalized tonic clonic seizures.  He underwent an outside brain biopsy that showed a diffuse high-grade glioma.  More recent brain MRI showed a right temporoparietal lesion with a focus of postcontrast enhancement and interval growth since the original imaging.  He presents to [facility] for a right posterior temporal craniotomy and tumor resection.    Gross Examination    A. Brain tissue, received fresh for frozen section and placed in formalin on 11-27-23 at 1050 is a 1.2 x 1.0 x 0.4 cm aggregate of tan tissue. Representative section frozen as A1, all remaining tissue submitted in A2-A3.   B. Brain tissue, received fresh and placed in formalin on 11-27-23 at 1300 3.4 x 2.4 x 0.6 cm aggregate of tan tissue. Submitted entirely in B1-B6.   Bovender/Dr. Factor    Assessment/Plan Glioblastoma multiforme of temporal lobe [facility])  We appreciate the opportunity to participate in the care of [employer].   We had an extensive conversation with him regarding pathology, prognosis, and available treatment pathways for glioblastoma.    We ultimately recommended proceeding with course of intensity modulated radiation therapy and concurrent daily Temozolomide .  Radiation will be administered Mon-Fri over 6 weeks, Temodar  will be dosed at 75mg /m2 to be given daily over 42 days.  We reviewed side effects of temodar , including fatigue, nausea/vomiting, constipation, and cytopenias.  Informed consent was verbally obtained at bedside to proceed with oral chemotherapy.  Chemotherapy should be held for the following:  ANC less than 1,000  Platelets less than 100,000  LFT or creatinine greater than 2x ULN  If clinical concerns/contraindications develop  Every 2 weeks during radiation, labs will be checked accompanied by a clinical evaluation in the brain tumor clinic.  He should continue Keppra 1000mg  BID, Vimpat  200mg  BID for now.  Screening for potential clinical trials was performed and discussed using eligibility criteria for active protocols at [facility], loco-regional tertiary centers, as well as national database available on Groundtransfer.at.    The patient is not a candidate for a research protocol at this time due to patient preference.   We spent twenty additional minutes teaching regarding the natural history, biology, and historical experience  in  the treatment of brain tumors. We then discussed in detail the current recommendations for therapy focusing on the mode of administration, mechanism of action, anticipated toxicities, and quality of life issues associated with this plan. We also provided teaching sheets for the patient to take home as an additional resource.  All questions were answered. The patient knows to call the clinic with any problems, questions or concerns. No barriers to learning were detected.  The total time spent in the encounter was 60 minutes and more than 50% was on counseling and review of test results   Arthea MARLA Manns, MD Medical Director of Neuro-Oncology [facility] 12/16/23 3:02 PM

## 2023-12-16 NOTE — Addendum Note (Signed)
 Addended by: Henreitta Leber on: 12/16/2023 04:43 PM   Modules accepted: Orders

## 2023-12-17 ENCOUNTER — Ambulatory Visit
Admission: RE | Admit: 2023-12-17 | Discharge: 2023-12-17 | Disposition: A | Discharge status: Home / Self Care | Payer: Medicare Other | Source: Ambulatory Visit | Attending: Radiation Oncology | Admitting: Radiation Oncology

## 2023-12-17 ENCOUNTER — Other Ambulatory Visit: Payer: Self-pay | Admitting: Radiation Therapy

## 2023-12-17 ENCOUNTER — Other Ambulatory Visit (HOSPITAL_COMMUNITY): Payer: Self-pay

## 2023-12-17 ENCOUNTER — Telehealth: Payer: Self-pay | Admitting: Pharmacist

## 2023-12-17 ENCOUNTER — Ambulatory Visit (HOSPITAL_COMMUNITY)
Admission: RE | Admit: 2023-12-17 | Discharge: 2023-12-17 | Disposition: A | Discharge status: Home / Self Care | Payer: Medicare Other | Source: Ambulatory Visit | Attending: Radiation Oncology | Admitting: Radiation Oncology

## 2023-12-17 ENCOUNTER — Telehealth: Payer: Self-pay | Admitting: Pharmacy Technician

## 2023-12-17 VITALS — BP 141/90 | HR 67 | Temp 97.7°F | Resp 20 | Ht 73.0 in | Wt 220.2 lb

## 2023-12-17 DIAGNOSIS — C719 Malignant neoplasm of brain, unspecified: Secondary | ICD-10-CM | POA: Insufficient documentation

## 2023-12-17 DIAGNOSIS — Z79899 Other long term (current) drug therapy: Secondary | ICD-10-CM | POA: Insufficient documentation

## 2023-12-17 DIAGNOSIS — Z79630 Long term (current) use of alkylating agent: Secondary | ICD-10-CM | POA: Insufficient documentation

## 2023-12-17 DIAGNOSIS — C712 Malignant neoplasm of temporal lobe: Secondary | ICD-10-CM | POA: Insufficient documentation

## 2023-12-17 DIAGNOSIS — C714 Malignant neoplasm of occipital lobe: Secondary | ICD-10-CM

## 2023-12-17 DIAGNOSIS — Z803 Family history of malignant neoplasm of breast: Secondary | ICD-10-CM | POA: Insufficient documentation

## 2023-12-17 DIAGNOSIS — Z801 Family history of malignant neoplasm of trachea, bronchus and lung: Secondary | ICD-10-CM | POA: Insufficient documentation

## 2023-12-17 DIAGNOSIS — R220 Localized swelling, mass and lump, head: Secondary | ICD-10-CM | POA: Diagnosis not present

## 2023-12-17 DIAGNOSIS — Z85038 Personal history of other malignant neoplasm of large intestine: Secondary | ICD-10-CM | POA: Insufficient documentation

## 2023-12-17 MED ORDER — TEMOZOLOMIDE 20 MG PO CAPS
20.0000 mg | ORAL_CAPSULE | Freq: Every day | ORAL | 0 refills | Status: DC
  Filled 2023-12-18: qty 28, 28d supply, fill #0
  Filled 2024-01-14: qty 14, 14d supply, fill #1

## 2023-12-17 MED ORDER — TEMOZOLOMIDE 140 MG PO CAPS
140.0000 mg | ORAL_CAPSULE | Freq: Every day | ORAL | 0 refills | Status: DC
  Filled 2023-12-18: qty 28, 28d supply, fill #0
  Filled 2024-01-14: qty 14, 14d supply, fill #1

## 2023-12-17 MED ORDER — GADOBUTROL 1 MMOL/ML IV SOLN
10.0000 mL | Freq: Once | INTRAVENOUS | Status: AC | PRN
  Administered 2023-12-17: 10 mL via INTRAVENOUS

## 2023-12-17 NOTE — Telephone Encounter (Signed)
 Oral Oncology Patient Advocate Encounter  After completing a benefits investigation, prior authorization for temozolomide  is not required at this time through [facility].  Patient's copay is $42.92 for #42 of temozolomide  20mg   Patient's copay is $111.83 for #42 of temozolomide  140mg   Total cost for #42 days of therapy is $154.75   Paulette Borrow, CPhT-Adv Oncology Pharmacy Patient Advocate [facility] Direct Number: [phone]  Fax: [phone]

## 2023-12-17 NOTE — Telephone Encounter (Signed)
 Oral Oncology Pharmacist Encounter  Received new prescription for Temodar  (temozolomide ) for the treatment of glioblastoma in conjunction with radiation, planned duration 42 days.  BMP and CBC from 11/27/23 assessed, no relevant lab abnormalities requiring baseline dose adjustment required at this time. Prescription dose and frequency assessed for appropriateness.  Current medication list in Epic reviewed, no relevant/significant DDIs with Temodar  identified.  Evaluated chart and no patient barriers to medication adherence noted.   Patient agreement for treatment documented in MD note on 12/16/23.  Prescription has been e-scribed to the [facility] for benefits analysis and approval.  Oral Oncology Clinic will continue to follow for insurance authorization, copayment issues, initial counseling and start date.  Jude Norton, PharmD, BCPS, BCOP Hematology/Oncology Clinical Pharmacist [facility] and [facility] Oral Chemotherapy Navigation Clinics [phone] 12/17/2023 9:07 AM

## 2023-12-18 ENCOUNTER — Other Ambulatory Visit: Payer: Self-pay

## 2023-12-18 ENCOUNTER — Other Ambulatory Visit (HOSPITAL_COMMUNITY): Payer: Self-pay

## 2023-12-18 ENCOUNTER — Other Ambulatory Visit: Payer: Self-pay | Admitting: Pharmacy Technician

## 2023-12-18 ENCOUNTER — Encounter: Payer: Self-pay | Admitting: Radiation Oncology

## 2023-12-18 ENCOUNTER — Telehealth: Payer: Self-pay | Admitting: Licensed Clinical Social Worker

## 2023-12-18 ENCOUNTER — Encounter: Payer: Self-pay | Admitting: Internal Medicine

## 2023-12-18 DIAGNOSIS — C712 Malignant neoplasm of temporal lobe: Secondary | ICD-10-CM | POA: Diagnosis not present

## 2023-12-18 DIAGNOSIS — C719 Malignant neoplasm of brain, unspecified: Secondary | ICD-10-CM

## 2023-12-18 MED ORDER — ONDANSETRON HCL 8 MG PO TABS: 8.0000 mg | ORAL_TABLET | Freq: Three times a day (TID) | ORAL | 1 refills | Status: DC | PRN

## 2023-12-18 NOTE — Telephone Encounter (Signed)
Oral Chemotherapy Pharmacist Encounter  I spoke with patient and patient's wife, Trevor Contreras, for overview of: Temodar for the treatment of glioblastoma multiforme in conjunction with radiation, planned duration concomitant phase 42 days of therapy.   Counseled patient on administration, dosing, side effects, monitoring, drug-food interactions, safe handling, storage, and disposal.  Patient will take Temodar 140mg  capsules and Temodar 20mg  capsules, 160 mg total daily dose, by mouth once daily, may take at bedtime and on an empty stomach to decrease nausea and vomiting.  Patient will take Temodar concurrent with radiation for 42 days straight.  Temodar start date: 12/28/23 PM Radiation start date: 12/29/23  Adverse effects include but are not limited to: nausea, vomiting, GI upset, rash, and decreased blood counts, and fatigue.  Prophylactic Zofran will not be used at initiation of concurrent phase, but will be initiated if nausea develops despite Temodar administration on an empty stomach and at bedtime. If this occurs, patient will take Zofran 8 mg tablet, 1 tablet by mouth 30-60 min prior to Temodar dose to help decrease N/V. Patient's wife request that ondansetron be sent to their [facility] in [facility] for p/u - I have redirected this prescription for them.    Reviewed with patient importance of keeping a medication schedule and plan for any missed doses. No barriers to medication adherence identified.  Medication reconciliation performed and medication/allergy list updated.  All questions answered.  Trevor Contreras voiced understanding and appreciation.   Medication education handout and temozolomide medication calendar placed in mail for patient. Patient knows to call the office with questions or concerns. Oral Chemotherapy Clinic phone number provided to patient.   Sherry Ruffing, PharmD, BCPS, BCOP Hematology/Oncology Clinical Pharmacist [facility] and [facility] Oral  Chemotherapy Navigation Clinics [phone] 12/18/2023 10:32 AM

## 2023-12-18 NOTE — Progress Notes (Signed)
Specialty Pharmacy Initial Fill Coordination Note  Trevor Contreras is a 68 y.o. male contacted today regarding refills of specialty medication(s) Temozolomide (TEMODAR) .  Patient requested Delivery  on 12/22/23  to verified address [address]   [place] [address]   Medication will be filled on 12/18/23.   Patient is aware of $154.75 copayment.

## 2023-12-18 NOTE — Telephone Encounter (Signed)
Spoke w/ pt regarding comprehensive assessments to be completed at the start of treatment.  Pt booked w/ wife to meet w/ CSW on 1/17 to complete assessments.

## 2023-12-18 NOTE — Progress Notes (Signed)
Has armband been applied?  Yes.    Does patient have an allergy to IV contrast dye?: No. Patient reports having a headache after MRI contrast.    Has patient ever received premedication for IV contrast dye?: No.   Does patient take metformin?: No.  If patient does take metformin when was the last dose: November 27, 2023  Date of lab work: 11/27/2023 BUN: 10 CR: 0.9 eGfr: 92  IV site: antecubital left, condition patent and no redness  Has IV site been added to flowsheet?  Yes.     BP (!) 148/93 (BP Location: Left Arm, Patient Position: Sitting, Cuff Size: Large)   Pulse 67   Temp 97.8 F (36.6 C)   Resp 18   Ht 6\' 1"  (1.854 m)   Wt 215 lb 9.6 oz (97.8 kg)   SpO2 99%   BMI 28.44 kg/m

## 2023-12-18 NOTE — Progress Notes (Signed)
Oral Chemotherapy Pharmacist Encounter  Patient was counseled under telephone encounter from 12/17/23.  Sherry Ruffing, PharmD, BCPS, BCOP Hematology/Oncology Clinical Pharmacist [facility] and [facility] Oral Chemotherapy Navigation Clinics [phone] 12/18/2023 10:39 AM

## 2023-12-19 ENCOUNTER — Ambulatory Visit
Admission: RE | Admit: 2023-12-19 | Discharge: 2023-12-19 | Disposition: A | Discharge status: Home / Self Care | Payer: Medicare Other | Source: Ambulatory Visit | Attending: Radiation Oncology | Admitting: Radiation Oncology

## 2023-12-19 ENCOUNTER — Ambulatory Visit
Admission: RE | Admit: 2023-12-19 | Discharge: 2023-12-19 | Disposition: A | Discharge status: Home / Self Care | Payer: Medicare Other | Source: Ambulatory Visit | Attending: Radiation Oncology

## 2023-12-19 ENCOUNTER — Inpatient Hospital Stay: Payer: Medicare Other | Admitting: Licensed Clinical Social Worker

## 2023-12-19 VITALS — BP 148/93 | HR 67 | Temp 97.8°F | Resp 18 | Ht 73.0 in | Wt 215.6 lb

## 2023-12-19 DIAGNOSIS — C712 Malignant neoplasm of temporal lobe: Secondary | ICD-10-CM | POA: Insufficient documentation

## 2023-12-19 DIAGNOSIS — Z510 Encounter for antineoplastic radiation therapy: Secondary | ICD-10-CM | POA: Diagnosis not present

## 2023-12-19 DIAGNOSIS — C714 Malignant neoplasm of occipital lobe: Secondary | ICD-10-CM

## 2023-12-22 ENCOUNTER — Other Ambulatory Visit: Payer: Self-pay

## 2023-12-22 ENCOUNTER — Telehealth: Payer: Self-pay | Admitting: Internal Medicine

## 2023-12-22 ENCOUNTER — Encounter: Payer: Self-pay | Admitting: *Deleted

## 2023-12-22 NOTE — Progress Notes (Signed)
Temodar ordered Radiation oncology start date 12/29/2023 Radiation Oncology end date 02/06/2024  Requested Lab and MD visits with Dr Barbaraann Cao around weeks 2, 4 and 6.  Scheduling request sent on 12/22/2023

## 2023-12-22 NOTE — Telephone Encounter (Signed)
Patient spouse is aware of scheduled appointment time/dates

## 2023-12-25 DIAGNOSIS — C712 Malignant neoplasm of temporal lobe: Secondary | ICD-10-CM | POA: Diagnosis not present

## 2023-12-25 DIAGNOSIS — Z510 Encounter for antineoplastic radiation therapy: Secondary | ICD-10-CM | POA: Diagnosis not present

## 2023-12-29 ENCOUNTER — Ambulatory Visit
Admission: RE | Admit: 2023-12-29 | Discharge: 2023-12-29 | Disposition: A | Discharge status: Home / Self Care | Payer: Medicare Other | Source: Ambulatory Visit | Attending: Radiation Oncology | Admitting: Radiation Oncology

## 2023-12-29 ENCOUNTER — Other Ambulatory Visit (HOSPITAL_COMMUNITY): Payer: Self-pay

## 2023-12-29 ENCOUNTER — Other Ambulatory Visit: Payer: Self-pay

## 2023-12-29 DIAGNOSIS — Z510 Encounter for antineoplastic radiation therapy: Secondary | ICD-10-CM | POA: Diagnosis not present

## 2023-12-29 DIAGNOSIS — C712 Malignant neoplasm of temporal lobe: Secondary | ICD-10-CM | POA: Diagnosis not present

## 2023-12-29 LAB — RAD ONC ARIA SESSION SUMMARY
Course Elapsed Days: 0
Plan Fractions Treated to Date: 1
Plan Prescribed Dose Per Fraction: 2 Gy
Plan Total Fractions Prescribed: 23
Plan Total Prescribed Dose: 46 Gy
Reference Point Dosage Given to Date: 2 Gy
Reference Point Session Dosage Given: 2 Gy
Session Number: 1

## 2023-12-30 ENCOUNTER — Ambulatory Visit
Admission: RE | Admit: 2023-12-30 | Discharge: 2023-12-30 | Disposition: A | Discharge status: Home / Self Care | Payer: Medicare Other | Source: Ambulatory Visit | Attending: Radiation Oncology | Admitting: Radiation Oncology

## 2023-12-30 ENCOUNTER — Other Ambulatory Visit: Payer: Self-pay

## 2023-12-30 DIAGNOSIS — Z510 Encounter for antineoplastic radiation therapy: Secondary | ICD-10-CM | POA: Diagnosis not present

## 2023-12-30 DIAGNOSIS — C712 Malignant neoplasm of temporal lobe: Secondary | ICD-10-CM | POA: Diagnosis not present

## 2023-12-30 LAB — RAD ONC ARIA SESSION SUMMARY
Course Elapsed Days: 1
Plan Fractions Treated to Date: 2
Plan Prescribed Dose Per Fraction: 2 Gy
Plan Total Fractions Prescribed: 23
Plan Total Prescribed Dose: 46 Gy
Reference Point Dosage Given to Date: 4 Gy
Reference Point Session Dosage Given: 2 Gy
Session Number: 2

## 2023-12-31 ENCOUNTER — Other Ambulatory Visit: Payer: Self-pay

## 2023-12-31 ENCOUNTER — Ambulatory Visit
Admission: RE | Admit: 2023-12-31 | Discharge: 2023-12-31 | Disposition: A | Discharge status: Home / Self Care | Payer: Medicare Other | Source: Ambulatory Visit | Attending: Radiation Oncology | Admitting: Radiation Oncology

## 2023-12-31 DIAGNOSIS — C712 Malignant neoplasm of temporal lobe: Secondary | ICD-10-CM | POA: Diagnosis not present

## 2023-12-31 DIAGNOSIS — Z510 Encounter for antineoplastic radiation therapy: Secondary | ICD-10-CM | POA: Diagnosis not present

## 2023-12-31 LAB — RAD ONC ARIA SESSION SUMMARY
Course Elapsed Days: 2
Plan Fractions Treated to Date: 3
Plan Prescribed Dose Per Fraction: 2 Gy
Plan Total Fractions Prescribed: 23
Plan Total Prescribed Dose: 46 Gy
Reference Point Dosage Given to Date: 6 Gy
Reference Point Session Dosage Given: 2 Gy
Session Number: 3

## 2024-01-01 ENCOUNTER — Other Ambulatory Visit: Payer: Self-pay

## 2024-01-01 ENCOUNTER — Inpatient Hospital Stay: Payer: Medicare Other | Admitting: Licensed Clinical Social Worker

## 2024-01-01 ENCOUNTER — Ambulatory Visit
Admission: RE | Admit: 2024-01-01 | Discharge: 2024-01-01 | Disposition: A | Discharge status: Home / Self Care | Payer: Medicare Other | Source: Ambulatory Visit | Attending: Radiation Oncology | Admitting: Radiation Oncology

## 2024-01-01 DIAGNOSIS — Z510 Encounter for antineoplastic radiation therapy: Secondary | ICD-10-CM | POA: Diagnosis not present

## 2024-01-01 DIAGNOSIS — C719 Malignant neoplasm of brain, unspecified: Secondary | ICD-10-CM

## 2024-01-01 DIAGNOSIS — C712 Malignant neoplasm of temporal lobe: Secondary | ICD-10-CM | POA: Diagnosis not present

## 2024-01-01 LAB — RAD ONC ARIA SESSION SUMMARY
Course Elapsed Days: 3
Plan Fractions Treated to Date: 4
Plan Prescribed Dose Per Fraction: 2 Gy
Plan Total Fractions Prescribed: 23
Plan Total Prescribed Dose: 46 Gy
Reference Point Dosage Given to Date: 8 Gy
Reference Point Session Dosage Given: 2 Gy
Session Number: 4

## 2024-01-01 NOTE — Progress Notes (Signed)
[facility] Clinical Social Work  Initial Assessment   Trevor Contreras is a 68 y.o. year old male accompanied by patient and wife. Clinical Social Work was referred by medical provider for assessment of psychosocial needs.   SDOH (Social Determinants of Health) assessments performed: Yes SDOH Interventions    Flowsheet Row Telephone from 11/14/2023 in [place] POPULATION HEALTH DEPARTMENT  SDOH Interventions   Food Insecurity Interventions Intervention Not Indicated  Housing Interventions Intervention Not Indicated  Transportation Interventions Intervention Not Indicated  Utilities Interventions Intervention Not Indicated       SDOH Screenings   Food Insecurity: No Food Insecurity (11/22/2023)   Received from [facility]  Housing: Low Risk  (11/22/2023)   Received from [facility]  Transportation Needs: No Transportation Needs (11/22/2023)   Received from [facility]  Utilities: Not At Risk (11/22/2023)   Received from [facility]  Depression [phone]): Low Risk  (02/19/2023)  Financial Resource Strain: Low Risk  (11/22/2023)   Received from [facility]  Physical Activity: Sufficiently Active (11/17/2023)   Received from [facility]  Social Connections: Socially Integrated (11/17/2023)   Received from [facility]  Stress: Stress Concern Present (11/17/2023)   Received from [facility]  Tobacco Use: Low Risk  (11/24/2023)   Received from [facility]  Recent Concern: Tobacco Use - Medium Risk (11/24/2023)   Received from [facility]     Distress Screen completed: No     No data to display            Family/Social Information:  Housing Arrangement: patient lives with his wife. Family members/support persons in your life? Pt and his wife have a son and daughter who reside nearby and are able to assist as needed.  Pt's wife reports they have good friends that  are also supportive. Transportation concerns: no  Employment: Retired pt retired as a result of his recent diagnosis and the need for surgery.  Pt worked as a Engineer, mining.  Pt's wife retired in July.  Income source: Actor concerns: No Type of concern: None Food access concerns: no Religious or spiritual practice: Yes-Catholic Advanced directives: No Services Currently in place:  none  Coping/ Adjustment to diagnosis: Patient understands treatment plan and what happens next? yes Concerns about diagnosis and/or treatment: Quality of life Patient reported stressors: Anxiety/ nervousness and Adjusting to my illness Hopes and/or priorities: Pt's priority is to continue with treatment w/ the hope of positive results. Patient enjoys fishing, being outside, and exercise Current coping skills/ strengths: Capable of independent living , Motivation for treatment/growth , Physical Health , and Supportive family/friends     SUMMARY: Current SDOH Barriers:  No barriers identified  Clinical Social Work Clinical Goal(s):  No clinical social work goals at this time  Interventions: Discussed common feeling and emotions when being diagnosed with cancer, and the importance of support during treatment Informed patient of the support team roles and support services at [facility] Provided CSW contact information and encouraged patient to call with any questions or concerns    Follow Up Plan: Patient will contact CSW with any support or resource needs Patient verbalizes understanding of plan: Yes    Rachel Moulds, LCSW Clinical Social Worker [facility]

## 2024-01-01 NOTE — Progress Notes (Signed)
Brain Tumor Assessment   Clinical social worker met with patient / caregiver , wife Iris  to complete assessments.      Patient distress screen score:     01/01/2024    3:10 PM  ONCBCN DISTRESS SCREENING  Distress experienced in past week (1-10) 4  Practical problem type Work/school  Emotional problem type Nervousness/Anxiety;Adjusting to illness  Information Concerns Type Lack of info about treatment  Physical Problem type Sleep/insomnia;Skin dry/itchy   Caregiver distress screen score: 8 Significant physical limits/changes identified:  Yes: vision continues to be impacted.  Pt and wife report memory is slightly impaired.  Per pt the most frustrating side effect is exhaustion.  Pt was very physically active prior to diagnosis and enjoyed working out.  Of note pt states his balance is slightly off, he is more easily irritable and is not able to tolerate loud noises or cold (pt normally would attend his granddaughters basketball game, but since surgery is not able to handle a lot of noise)       MOCA score:     01/01/2024    3:06 PM  Montreal Cognitive Assessment   Visuospatial/ Executive (0/5) 3  Naming (0/3) 3  Attention: Read list of digits (0/2) 2  Attention: Read list of letters (0/1) 1  Attention: Serial 7 subtraction starting at 100 (0/3) 3  Language: Repeat phrase (0/2) 2  Language : Fluency (0/1) 0  Abstraction (0/2) 2  Delayed Recall (0/5) 1  Orientation (0/6) 5  Total 22   Significant findings: Immediate recall is very good, delayed recall is a challenge  WHO Quality of Life scores:   Overall quality of life: 40   Physical health: 40   Psychological: 4   Social relationship: 67   Environment: 75 Significant findings: Pt's greatest struggle is exhaustion, poor sleep and the uncertainty of knowing what his life expectancy might be.  PATH2Caregiving score: 88  Areas of Concern: Uncertainty of pt's expected recovery over the next 6 months and lack of experience  providing physical care to someone.    Rachel Moulds, LCSW

## 2024-01-02 ENCOUNTER — Ambulatory Visit
Admission: RE | Admit: 2024-01-02 | Discharge: 2024-01-02 | Disposition: A | Discharge status: Home / Self Care | Payer: Medicare Other | Source: Ambulatory Visit | Attending: Radiation Oncology | Admitting: Radiation Oncology

## 2024-01-02 ENCOUNTER — Other Ambulatory Visit: Payer: Self-pay

## 2024-01-02 ENCOUNTER — Ambulatory Visit
Admission: RE | Admit: 2024-01-02 | Discharge: 2024-01-02 | Disposition: A | Discharge status: Home / Self Care | Payer: Medicare Other | Source: Ambulatory Visit | Attending: Radiation Oncology

## 2024-01-02 DIAGNOSIS — C712 Malignant neoplasm of temporal lobe: Secondary | ICD-10-CM | POA: Diagnosis not present

## 2024-01-02 DIAGNOSIS — C714 Malignant neoplasm of occipital lobe: Secondary | ICD-10-CM

## 2024-01-02 DIAGNOSIS — Z510 Encounter for antineoplastic radiation therapy: Secondary | ICD-10-CM | POA: Diagnosis not present

## 2024-01-02 LAB — RAD ONC ARIA SESSION SUMMARY
Course Elapsed Days: 4
Plan Fractions Treated to Date: 5
Plan Prescribed Dose Per Fraction: 2 Gy
Plan Total Fractions Prescribed: 23
Plan Total Prescribed Dose: 46 Gy
Reference Point Dosage Given to Date: 10 Gy
Reference Point Session Dosage Given: 2 Gy
Session Number: 5

## 2024-01-02 MED ORDER — SONAFINE EX EMUL
1.0000 | Freq: Once | CUTANEOUS | Status: AC
  Administered 2024-01-02: 1 via TOPICAL

## 2024-01-05 ENCOUNTER — Ambulatory Visit
Admission: RE | Admit: 2024-01-05 | Discharge: 2024-01-05 | Disposition: A | Discharge status: Home / Self Care | Payer: Medicare Other | Source: Ambulatory Visit | Attending: Radiation Oncology | Admitting: Radiation Oncology

## 2024-01-05 ENCOUNTER — Other Ambulatory Visit: Payer: Self-pay

## 2024-01-05 DIAGNOSIS — Z510 Encounter for antineoplastic radiation therapy: Secondary | ICD-10-CM | POA: Diagnosis not present

## 2024-01-05 DIAGNOSIS — C712 Malignant neoplasm of temporal lobe: Secondary | ICD-10-CM | POA: Insufficient documentation

## 2024-01-05 LAB — RAD ONC ARIA SESSION SUMMARY
Course Elapsed Days: 7
Plan Fractions Treated to Date: 6
Plan Prescribed Dose Per Fraction: 2 Gy
Plan Total Fractions Prescribed: 23
Plan Total Prescribed Dose: 46 Gy
Reference Point Dosage Given to Date: 12 Gy
Reference Point Session Dosage Given: 2 Gy
Session Number: 6

## 2024-01-06 ENCOUNTER — Other Ambulatory Visit: Payer: Self-pay

## 2024-01-06 ENCOUNTER — Ambulatory Visit
Admission: RE | Admit: 2024-01-06 | Discharge: 2024-01-06 | Disposition: A | Discharge status: Home / Self Care | Payer: Medicare Other | Source: Ambulatory Visit | Attending: Radiation Oncology

## 2024-01-06 DIAGNOSIS — C712 Malignant neoplasm of temporal lobe: Secondary | ICD-10-CM | POA: Diagnosis not present

## 2024-01-06 DIAGNOSIS — Z510 Encounter for antineoplastic radiation therapy: Secondary | ICD-10-CM | POA: Diagnosis not present

## 2024-01-06 LAB — RAD ONC ARIA SESSION SUMMARY
Course Elapsed Days: 8
Plan Fractions Treated to Date: 7
Plan Prescribed Dose Per Fraction: 2 Gy
Plan Total Fractions Prescribed: 23
Plan Total Prescribed Dose: 46 Gy
Reference Point Dosage Given to Date: 14 Gy
Reference Point Session Dosage Given: 2 Gy
Session Number: 7

## 2024-01-07 ENCOUNTER — Ambulatory Visit
Admission: RE | Admit: 2024-01-07 | Discharge: 2024-01-07 | Disposition: A | Discharge status: Home / Self Care | Payer: Medicare Other | Source: Ambulatory Visit | Attending: Radiation Oncology

## 2024-01-07 ENCOUNTER — Other Ambulatory Visit: Payer: Self-pay

## 2024-01-07 DIAGNOSIS — Z510 Encounter for antineoplastic radiation therapy: Secondary | ICD-10-CM | POA: Diagnosis not present

## 2024-01-07 DIAGNOSIS — C712 Malignant neoplasm of temporal lobe: Secondary | ICD-10-CM | POA: Diagnosis not present

## 2024-01-07 LAB — RAD ONC ARIA SESSION SUMMARY
Course Elapsed Days: 9
Plan Fractions Treated to Date: 8
Plan Prescribed Dose Per Fraction: 2 Gy
Plan Total Fractions Prescribed: 23
Plan Total Prescribed Dose: 46 Gy
Reference Point Dosage Given to Date: 16 Gy
Reference Point Session Dosage Given: 2 Gy
Session Number: 8

## 2024-01-08 ENCOUNTER — Inpatient Hospital Stay: Payer: Medicare Other | Admitting: Internal Medicine

## 2024-01-08 ENCOUNTER — Other Ambulatory Visit: Payer: Self-pay

## 2024-01-08 ENCOUNTER — Inpatient Hospital Stay: Payer: Medicare Other

## 2024-01-08 ENCOUNTER — Telehealth: Payer: Self-pay | Admitting: Internal Medicine

## 2024-01-08 ENCOUNTER — Ambulatory Visit
Admission: RE | Admit: 2024-01-08 | Discharge: 2024-01-08 | Disposition: A | Discharge status: Home / Self Care | Payer: Medicare Other | Source: Ambulatory Visit | Attending: Radiation Oncology | Admitting: Radiation Oncology

## 2024-01-08 VITALS — BP 121/77 | HR 59 | Temp 97.2°F | Resp 13 | Wt 214.7 lb

## 2024-01-08 DIAGNOSIS — C719 Malignant neoplasm of brain, unspecified: Secondary | ICD-10-CM

## 2024-01-08 DIAGNOSIS — C714 Malignant neoplasm of occipital lobe: Secondary | ICD-10-CM | POA: Insufficient documentation

## 2024-01-08 DIAGNOSIS — Z510 Encounter for antineoplastic radiation therapy: Secondary | ICD-10-CM | POA: Diagnosis not present

## 2024-01-08 DIAGNOSIS — C712 Malignant neoplasm of temporal lobe: Secondary | ICD-10-CM | POA: Diagnosis not present

## 2024-01-08 DIAGNOSIS — Z79899 Other long term (current) drug therapy: Secondary | ICD-10-CM | POA: Insufficient documentation

## 2024-01-08 DIAGNOSIS — Z85038 Personal history of other malignant neoplasm of large intestine: Secondary | ICD-10-CM | POA: Insufficient documentation

## 2024-01-08 LAB — CBC WITH DIFFERENTIAL (CANCER CENTER ONLY)
Abs Immature Granulocytes: 0.01 10*3/uL (ref 0.00–0.07)
Basophils Absolute: 0 10*3/uL (ref 0.0–0.1)
Basophils Relative: 1 %
Eosinophils Absolute: 0.1 10*3/uL (ref 0.0–0.5)
Eosinophils Relative: 3 %
HCT: 38.6 % — ABNORMAL LOW (ref 39.0–52.0)
Hemoglobin: 12.6 g/dL — ABNORMAL LOW (ref 13.0–17.0)
Immature Granulocytes: 0 %
Lymphocytes Relative: 41 %
Lymphs Abs: 1.4 10*3/uL (ref 0.7–4.0)
MCH: 29 pg (ref 26.0–34.0)
MCHC: 32.6 g/dL (ref 30.0–36.0)
MCV: 88.7 fL (ref 80.0–100.0)
Monocytes Absolute: 0.5 10*3/uL (ref 0.1–1.0)
Monocytes Relative: 13 %
Neutro Abs: 1.5 10*3/uL — ABNORMAL LOW (ref 1.7–7.7)
Neutrophils Relative %: 42 %
Platelet Count: 214 10*3/uL (ref 150–400)
RBC: 4.35 MIL/uL (ref 4.22–5.81)
RDW: 14.3 % (ref 11.5–15.5)
WBC Count: 3.5 10*3/uL — ABNORMAL LOW (ref 4.0–10.5)
nRBC: 0 % (ref 0.0–0.2)

## 2024-01-08 LAB — CMP (CANCER CENTER ONLY)
ALT: 28 U/L (ref 0–44)
AST: 25 U/L (ref 15–41)
Albumin: 4 g/dL (ref 3.5–5.0)
Alkaline Phosphatase: 80 U/L (ref 38–126)
Anion gap: 4 — ABNORMAL LOW (ref 5–15)
BUN: 12 mg/dL (ref 8–23)
CO2: 30 mmol/L (ref 22–32)
Calcium: 9.2 mg/dL (ref 8.9–10.3)
Chloride: 106 mmol/L (ref 98–111)
Creatinine: 0.9 mg/dL (ref 0.61–1.24)
GFR, Estimated: >60 mL/min (ref >60)
Glucose, Bld: 82 mg/dL (ref 70–99)
Potassium: 3.9 mmol/L (ref 3.5–5.1)
Sodium: 140 mmol/L (ref 135–145)
Total Bilirubin: 0.5 mg/dL (ref 0.0–1.2)
Total Protein: 6.6 g/dL (ref 6.5–8.1)

## 2024-01-08 LAB — RAD ONC ARIA SESSION SUMMARY
Course Elapsed Days: 10
Plan Fractions Treated to Date: 9
Plan Prescribed Dose Per Fraction: 2 Gy
Plan Total Fractions Prescribed: 23
Plan Total Prescribed Dose: 46 Gy
Reference Point Dosage Given to Date: 18 Gy
Reference Point Session Dosage Given: 2 Gy
Session Number: 9

## 2024-01-08 NOTE — Telephone Encounter (Signed)
 Trevor Contreras

## 2024-01-08 NOTE — Progress Notes (Signed)
 [facility] [address]  [place], [place] [address] [phone]   Interval Evaluation  Date of Service: 01/08/24 Patient Name: Trevor Contreras Patient MRN: [id number] Patient DOB: [birth year] Provider: Arthea MARLA Manns, MD  Identifying Statement:  Brodric Contreras is a 68 y.o. male with right occipital glioblastoma    Oncologic History: Oncology History  Glioblastoma multiforme of occipital lobe (HCC)  11/27/2023 Surgery   Craniotomy, resection of right occipital mass with Dr. Saturnino; path is glioblastoma, [facility]   12/29/2023 -  Chemotherapy   Patient is on Treatment Plan : BRAIN GLIOBLASTOMA Radiation Therapy With Concurrent Temozolomide  75 mg/m2 Daily Followed By Sequential Maintenance Temozolomide  x 6-12 cycles       Biomarkers:  MGMT Unmethylated.  IDH 1/2 Wild type.  EGFR Unknown  TERT Unknown   Interval History: Neiman Roots presents today for follow up, now in week of IMRT and Temodar .  He is tolerating treatment well overall without ill effects.  Does complain of increased fatigue burden, especially following dosing of seizure medications.  Denies headaches, seizures.   H+P (12/16/23) Patient presented to neurologic attention on 11/09/23 with new onset seizures, characterized as flashing lights in the left peripheral vision. CNS imaging demonstrated enhancing mass in the right occipital lobe, c/w primary tumor.  He underwent craniotomy, resection with Dr. Saturnino at [facility] on 11/27/23.  Following surgery, he has had few issues, mostly normal vision.  Is independent with activities of daily living, no further seizures.  Medications: Current Outpatient Medications on File Prior to Visit  Medication Sig Dispense Refill   diclofenac (VOLTAREN) 75 MG EC tablet Take 75 mg by mouth as needed. (Patient not taking: Reported on 12/17/2023)     gabapentin (NEURONTIN) 300 MG capsule Take 300 mg by mouth at bedtime.     lacosamide  (VIMPAT ) 200 MG  TABS tablet Take 200 mg by mouth 2 (two) times daily.     levETIRAcetam (KEPPRA) 1000 MG tablet Take 1,000 mg by mouth 2 (two) times daily. Take for 30 days     Melatonin 10 MG CAPS Take 10 mg by mouth as needed (prn at bedtime for sleep).     methocarbamol (ROBAXIN) 500 MG tablet Take 500 mg by mouth 3 (three) times daily. (Patient not taking: Reported on 12/17/2023)     Multiple Vitamins-Minerals (CENTRUM ADULT PO) Take 1 tablet by mouth daily.     ondansetron  (ZOFRAN ) 8 MG tablet Take 1 tablet (8 mg total) by mouth every 8 (eight) hours as needed for nausea or vomiting. May take 30-60 minutes prior to Temodar  administration if nausea/vomiting occurs as needed. 30 tablet 1   ondansetron  (ZOFRAN -ODT) 4 MG disintegrating tablet Take 4 mg by mouth every 8 (eight) hours as needed. (Patient not taking: Reported on 12/17/2023)     senna-docusate (SENOKOT-S) 8.6-50 MG tablet Take 1 tablet by mouth 2 (two) times daily.     temozolomide  (TEMODAR ) 140 MG capsule Take 1 capsule (140 mg total) by mouth daily. (Take with one 20 mg capsule for total daily dose of 160 mg). May take on an empty stomach to decrease nausea & vomiting. 42 capsule 0   temozolomide  (TEMODAR ) 20 MG capsule Take 1 capsule (20 mg total) by mouth daily. (Take with one 140 mg capsule for total daily dose of 160 mg). May take on an empty stomach to decrease nausea & vomiting. 42 capsule 0   No current facility-administered medications on file prior to visit.    Allergies:  No Known Allergies Past Medical History:  Past Medical History:  Diagnosis Date   Colon cancer (HCC) 2002   Past Surgical History:  Past Surgical History:  Procedure Laterality Date   COLON SURGERY     COLONOSCOPY     EYE SURGERY     trying to correct blurred vision in left eye; unsuccessful   KNEE ARTHROSCOPY     bilat; several surgeries   SHOULDER ARTHROSCOPY     rotator cuff, reattach part of long bicep, Dr Levorn   Social History:  Social History    Socioeconomic History   Marital status: Married    Spouse name: Not on file   Number of children: Not on file   Years of education: Not on file   Highest education level: Not on file  Occupational History   Not on file  Tobacco Use   Smoking status: Never   Smokeless tobacco: Never  Vaping Use   Vaping status: Never Used  Substance and Sexual Activity   Alcohol use: Yes    Comment: 2-3 times weekly, beer   Drug use: No   Sexual activity: Not on file  Other Topics Concern   Not on file  Social History Narrative   Not on file   Social Drivers of Health   Financial Resource Strain: Low Risk  (11/22/2023)   Received from [facility]   Overall Financial Resource Strain (CARDIA)    Difficulty of Paying Living Expenses: Not hard at all  Food Insecurity: No Food Insecurity (11/22/2023)   Received from [facility]   Hunger Vital Sign    Worried About Running Out of Food in the Last Year: Never true    Ran Out of Food in the Last Year: Never true  Transportation Needs: No Transportation Needs (11/22/2023)   Received from [facility] - Transportation    In the past 12 months, has lack of transportation kept you from medical appointments or from getting medications?: No    Lack of Transportation (Non-Medical): No  Physical Activity: Sufficiently Active (11/17/2023)   Received from [facility]   Exercise Vital Sign    Days of Exercise per Week: 5 days    Minutes of Exercise per Session: 60 min  Stress: Stress Concern Present (11/17/2023)   Received from [facility] - Occupational Stress Questionnaire    Feeling of Stress : Very much  Social Connections: Socially Integrated (11/17/2023)   Received from [facility]   Social Network    How would you rate your social network (family, work, friends)?: Good participation with social networks  Intimate Partner Violence:  Not At Risk (11/17/2023)   Received from [facility]   HITS    Over the last 12 months how often did your partner physically hurt you?: Never    Over the last 12 months how often did your partner insult you or talk down to you?: Never    Over the last 12 months how often did your partner threaten you with physical harm?: Never    Over the last 12 months how often did your partner scream or curse at you?: Never   Family History:  Family History  Problem Relation Age of Onset   Cancer Mother        breast   Cancer Father        lung   Colon cancer Neg Hx    Colon polyps  Neg Hx    Esophageal cancer Neg Hx    Stomach cancer Neg Hx    Rectal cancer Neg Hx     Review of Systems: Constitutional: Doesn't report fevers, chills or abnormal weight loss Eyes: Doesn't report blurriness of vision Ears, nose, mouth, throat, and face: Doesn't report sore throat Respiratory: Doesn't report cough, dyspnea or wheezes Cardiovascular: Doesn't report palpitation, chest discomfort  Gastrointestinal:  Doesn't report nausea, constipation, diarrhea GU: Doesn't report incontinence Skin: Doesn't report skin rashes Neurological: Per HPI Musculoskeletal: Doesn't report joint pain Behavioral/Psych: Doesn't report anxiety  Physical Exam: Vitals:   01/08/24 0948  BP: 121/77  Pulse: (!) 59  Resp: 13  Temp: (!) 97.2 F (36.2 C)  SpO2: 100%    KPS: 80. General: Alert, cooperative, pleasant, in no acute distress Head: Normal EENT: No conjunctival injection or scleral icterus.  Lungs: Resp effort normal Cardiac: Regular rate Abdomen: Non-distended abdomen Skin: No rashes cyanosis or petechiae. Extremities: No clubbing or edema  Neurologic Exam: Mental Status: Awake, alert, attentive to examiner. Oriented to self and environment. Language is fluent with intact comprehension.  Cranial Nerves: Visual acuity is grossly normal. Visual fields are full. Extra-ocular movements intact. No ptosis. Face  is symmetric Motor: Tone and bulk are normal. Power is full in both arms and legs. Reflexes are symmetric, no pathologic reflexes present.  Sensory: Intact to light touch Gait: Normal.   Labs: I have reviewed the data as listed    Component Value Date/Time   NA 140 10/18/2022 0949   K 4.2 10/18/2022 0949   CL 105 10/18/2022 0949   CO2 22 10/18/2022 0949   GLUCOSE 105 (H) 10/18/2022 0949   GLUCOSE 86 05/31/2013 1310   BUN 21 10/18/2022 0949   CREATININE 0.87 10/18/2022 0949   CREATININE 0.91 05/31/2013 1310   CALCIUM  9.4 10/18/2022 0949   PROT 6.3 10/18/2022 0949   ALBUMIN 4.0 10/18/2022 0949   AST 22 10/18/2022 0949   ALT 25 10/18/2022 0949   ALKPHOS 81 10/18/2022 0949   BILITOT 0.5 10/18/2022 0949   GFRNONAA 76 06/02/2020 1444   GFRNONAA >89 05/31/2013 1310   GFRAA 88 06/02/2020 1444   GFRAA >89 05/31/2013 1310   Lab Results  Component Value Date   WBC 5.1 10/18/2022   NEUTROABS 2.9 10/18/2022   HGB 13.2 10/18/2022   HCT 39.2 10/18/2022   MCV 88 10/18/2022   PLT 221 10/18/2022     Assessment/Plan Glioblastoma multiforme of occipital lobe (HCC)  Dae Highley is clinically stable today, now having completed 2 weeks of IMRT and concurrent Temodar .    We ultimately recommended continuing with course of intensity modulated radiation therapy and concurrent daily Temozolomide .  Radiation will be administered Mon-Fri over 6 weeks, Temodar  will be dosed at 75mg /m2 to be given daily over 42 days.  We reviewed side effects of temodar , including fatigue, nausea/vomiting, constipation, and cytopenias.  Informed consent was verbally obtained at bedside to proceed with oral chemotherapy.  Chemotherapy should be held for the following:  ANC less than 1,000  Platelets less than 100,000  LFT or creatinine greater than 2x ULN  If clinical concerns/contraindications develop  Every 2 weeks during radiation, labs will be checked accompanied by a clinical evaluation in the brain  tumor clinic.  We will check CBC next week as well given drop in neutrophils on today's labs.  Due to side effects, fatigue, lack of seizures, recommended decreasing Keppra to 500mg  BID x7 days, then STOPPING.  Will con't Vimpat   200mg  BID for now.  All questions were answered. The patient knows to call the clinic with any problems, questions or concerns. No barriers to learning were detected.  The total time spent in the encounter was 40 minutes and more than 50% was on counseling and review of test results   Arthea MARLA Manns, MD Medical Director of Neuro-Oncology [facility] [place] 01/08/24 9:39 AM

## 2024-01-09 ENCOUNTER — Ambulatory Visit
Admission: RE | Admit: 2024-01-09 | Discharge: 2024-01-09 | Disposition: A | Discharge status: Home / Self Care | Payer: Medicare Other | Source: Ambulatory Visit | Attending: Radiation Oncology

## 2024-01-09 ENCOUNTER — Ambulatory Visit
Admission: RE | Admit: 2024-01-09 | Discharge: 2024-01-09 | Disposition: A | Discharge status: Home / Self Care | Payer: Medicare Other | Source: Ambulatory Visit | Attending: Radiation Oncology | Admitting: Radiation Oncology

## 2024-01-09 ENCOUNTER — Other Ambulatory Visit: Payer: Self-pay

## 2024-01-09 DIAGNOSIS — Z510 Encounter for antineoplastic radiation therapy: Secondary | ICD-10-CM | POA: Diagnosis not present

## 2024-01-09 DIAGNOSIS — C712 Malignant neoplasm of temporal lobe: Secondary | ICD-10-CM | POA: Diagnosis not present

## 2024-01-09 LAB — RAD ONC ARIA SESSION SUMMARY
Course Elapsed Days: 11
Plan Fractions Treated to Date: 10
Plan Prescribed Dose Per Fraction: 2 Gy
Plan Total Fractions Prescribed: 23
Plan Total Prescribed Dose: 46 Gy
Reference Point Dosage Given to Date: 20 Gy
Reference Point Session Dosage Given: 2 Gy
Session Number: 10

## 2024-01-12 ENCOUNTER — Ambulatory Visit
Admission: RE | Admit: 2024-01-12 | Discharge: 2024-01-12 | Disposition: A | Discharge status: Home / Self Care | Payer: Medicare Other | Source: Ambulatory Visit | Attending: Radiation Oncology | Admitting: Radiation Oncology

## 2024-01-12 ENCOUNTER — Other Ambulatory Visit: Payer: Self-pay

## 2024-01-12 DIAGNOSIS — Z510 Encounter for antineoplastic radiation therapy: Secondary | ICD-10-CM | POA: Diagnosis not present

## 2024-01-12 DIAGNOSIS — C712 Malignant neoplasm of temporal lobe: Secondary | ICD-10-CM | POA: Diagnosis not present

## 2024-01-12 LAB — RAD ONC ARIA SESSION SUMMARY
Course Elapsed Days: 14
Plan Fractions Treated to Date: 11
Plan Prescribed Dose Per Fraction: 2 Gy
Plan Total Fractions Prescribed: 23
Plan Total Prescribed Dose: 46 Gy
Reference Point Dosage Given to Date: 22 Gy
Reference Point Session Dosage Given: 2 Gy
Session Number: 11

## 2024-01-13 ENCOUNTER — Ambulatory Visit
Admission: RE | Admit: 2024-01-13 | Discharge: 2024-01-13 | Disposition: A | Discharge status: Home / Self Care | Payer: Medicare Other | Source: Ambulatory Visit | Attending: Radiation Oncology | Admitting: Radiation Oncology

## 2024-01-13 ENCOUNTER — Other Ambulatory Visit (HOSPITAL_COMMUNITY): Payer: Self-pay

## 2024-01-13 ENCOUNTER — Other Ambulatory Visit: Payer: Self-pay

## 2024-01-13 DIAGNOSIS — Z510 Encounter for antineoplastic radiation therapy: Secondary | ICD-10-CM | POA: Diagnosis not present

## 2024-01-13 DIAGNOSIS — C712 Malignant neoplasm of temporal lobe: Secondary | ICD-10-CM | POA: Diagnosis not present

## 2024-01-13 LAB — RAD ONC ARIA SESSION SUMMARY
Course Elapsed Days: 15
Plan Fractions Treated to Date: 12
Plan Prescribed Dose Per Fraction: 2 Gy
Plan Total Fractions Prescribed: 23
Plan Total Prescribed Dose: 46 Gy
Reference Point Dosage Given to Date: 24 Gy
Reference Point Session Dosage Given: 2 Gy
Session Number: 12

## 2024-01-14 ENCOUNTER — Other Ambulatory Visit: Payer: Self-pay

## 2024-01-14 ENCOUNTER — Ambulatory Visit
Admission: RE | Admit: 2024-01-14 | Discharge: 2024-01-14 | Disposition: A | Discharge status: Home / Self Care | Payer: Medicare Other | Source: Ambulatory Visit | Attending: Radiation Oncology | Admitting: Radiation Oncology

## 2024-01-14 ENCOUNTER — Other Ambulatory Visit (HOSPITAL_COMMUNITY): Payer: Self-pay

## 2024-01-14 DIAGNOSIS — Z510 Encounter for antineoplastic radiation therapy: Secondary | ICD-10-CM | POA: Diagnosis not present

## 2024-01-14 DIAGNOSIS — C712 Malignant neoplasm of temporal lobe: Secondary | ICD-10-CM | POA: Diagnosis not present

## 2024-01-14 LAB — RAD ONC ARIA SESSION SUMMARY
Course Elapsed Days: 16
Plan Fractions Treated to Date: 13
Plan Prescribed Dose Per Fraction: 2 Gy
Plan Total Fractions Prescribed: 23
Plan Total Prescribed Dose: 46 Gy
Reference Point Dosage Given to Date: 26 Gy
Reference Point Session Dosage Given: 2 Gy
Session Number: 13

## 2024-01-14 NOTE — Progress Notes (Signed)
Specialty Pharmacy Refill Coordination Note  Trevor Contreras is a 69 y.o. male contacted today regarding refills of specialty medication(s) Temozolomide [facility])   Patient requested Delivery   Delivery date: 01/21/24   Verified address: [address]   [place] [address]   Medication will be filled on 01/20/24.   Final refill of remainder of 14 doses. Disenrolling.

## 2024-01-14 NOTE — Progress Notes (Signed)
Specialty Pharmacy Ongoing Clinical Assessment Note  Marquiz Sotelo is a 68 y.o. male who is being followed by the specialty pharmacy service for RxSp Oncology   Patient's specialty medication(s) reviewed today: Temozolomide (TEMODAR)   Missed doses in the last 4 weeks: 0   Patient/Caregiver asked additional questions regarding side effects. Patient wife wishes to know if nausea/fatigue will resolve after treatment complete. Advised that these symptoms should resolve after a period of time once treatment complete but if additional maintenance therapy is added it is possible similar adverse effects may occur.   Therapeutic benefit summary: Unable to assess   Adverse events/side effects summary: Experienced adverse events/side effects (nausea and fatigue)   Patient's therapy is appropriate to: Continue    Goals Addressed             This Visit's Progress    Slow Disease Progression       Patient is unable to be assessed as therapy was recently initiated. Patient will maintain adherence         Follow up:  no futher follow up as therapy is set to complete after remaining 14 doses.   Otto Herb Specialty Pharmacist

## 2024-01-15 ENCOUNTER — Inpatient Hospital Stay: Payer: Medicare Other

## 2024-01-15 ENCOUNTER — Ambulatory Visit
Admission: RE | Admit: 2024-01-15 | Discharge: 2024-01-15 | Disposition: A | Discharge status: Home / Self Care | Payer: Medicare Other | Source: Ambulatory Visit | Attending: Radiation Oncology | Admitting: Radiation Oncology

## 2024-01-15 ENCOUNTER — Other Ambulatory Visit: Payer: Self-pay

## 2024-01-15 DIAGNOSIS — C719 Malignant neoplasm of brain, unspecified: Secondary | ICD-10-CM

## 2024-01-15 DIAGNOSIS — C712 Malignant neoplasm of temporal lobe: Secondary | ICD-10-CM | POA: Diagnosis not present

## 2024-01-15 DIAGNOSIS — Z510 Encounter for antineoplastic radiation therapy: Secondary | ICD-10-CM | POA: Diagnosis not present

## 2024-01-15 LAB — RAD ONC ARIA SESSION SUMMARY
Course Elapsed Days: 17
Plan Fractions Treated to Date: 14
Plan Prescribed Dose Per Fraction: 2 Gy
Plan Total Fractions Prescribed: 23
Plan Total Prescribed Dose: 46 Gy
Reference Point Dosage Given to Date: 28 Gy
Reference Point Session Dosage Given: 2 Gy
Session Number: 14

## 2024-01-15 LAB — CMP (CANCER CENTER ONLY)
ALT: 27 U/L (ref 0–44)
AST: 24 U/L (ref 15–41)
Albumin: 4 g/dL (ref 3.5–5.0)
Alkaline Phosphatase: 86 U/L (ref 38–126)
Anion gap: 4 — ABNORMAL LOW (ref 5–15)
BUN: 12 mg/dL (ref 8–23)
CO2: 31 mmol/L (ref 22–32)
Calcium: 9.2 mg/dL (ref 8.9–10.3)
Chloride: 103 mmol/L (ref 98–111)
Creatinine: 0.89 mg/dL (ref 0.61–1.24)
GFR, Estimated: >60 mL/min (ref >60)
Glucose, Bld: 90 mg/dL (ref 70–99)
Potassium: 4.5 mmol/L (ref 3.5–5.1)
Sodium: 138 mmol/L (ref 135–145)
Total Bilirubin: 0.6 mg/dL (ref 0.0–1.2)
Total Protein: 6.6 g/dL (ref 6.5–8.1)

## 2024-01-15 LAB — CBC WITH DIFFERENTIAL (CANCER CENTER ONLY)
Abs Immature Granulocytes: 0.01 10*3/uL (ref 0.00–0.07)
Basophils Absolute: 0 10*3/uL (ref 0.0–0.1)
Basophils Relative: 1 %
Eosinophils Absolute: 0.1 10*3/uL (ref 0.0–0.5)
Eosinophils Relative: 3 %
HCT: 39.6 % (ref 39.0–52.0)
Hemoglobin: 12.9 g/dL — ABNORMAL LOW (ref 13.0–17.0)
Immature Granulocytes: 0 %
Lymphocytes Relative: 23 %
Lymphs Abs: 1.2 10*3/uL (ref 0.7–4.0)
MCH: 28.9 pg (ref 26.0–34.0)
MCHC: 32.6 g/dL (ref 30.0–36.0)
MCV: 88.8 fL (ref 80.0–100.0)
Monocytes Absolute: 0.7 10*3/uL (ref 0.1–1.0)
Monocytes Relative: 13 %
Neutro Abs: 3 10*3/uL (ref 1.7–7.7)
Neutrophils Relative %: 60 %
Platelet Count: 225 10*3/uL (ref 150–400)
RBC: 4.46 MIL/uL (ref 4.22–5.81)
RDW: 14.1 % (ref 11.5–15.5)
WBC Count: 5 10*3/uL (ref 4.0–10.5)
nRBC: 0 % (ref 0.0–0.2)

## 2024-01-16 ENCOUNTER — Ambulatory Visit
Admission: RE | Admit: 2024-01-16 | Discharge: 2024-01-16 | Disposition: A | Discharge status: Home / Self Care | Payer: Medicare Other | Source: Ambulatory Visit | Attending: Radiation Oncology | Admitting: Radiation Oncology

## 2024-01-16 ENCOUNTER — Other Ambulatory Visit: Payer: Self-pay

## 2024-01-16 DIAGNOSIS — C712 Malignant neoplasm of temporal lobe: Secondary | ICD-10-CM | POA: Diagnosis not present

## 2024-01-16 DIAGNOSIS — Z510 Encounter for antineoplastic radiation therapy: Secondary | ICD-10-CM | POA: Diagnosis not present

## 2024-01-16 LAB — RAD ONC ARIA SESSION SUMMARY
Course Elapsed Days: 18
Plan Fractions Treated to Date: 15
Plan Prescribed Dose Per Fraction: 2 Gy
Plan Total Fractions Prescribed: 23
Plan Total Prescribed Dose: 46 Gy
Reference Point Dosage Given to Date: 30 Gy
Reference Point Session Dosage Given: 2 Gy
Session Number: 15

## 2024-01-19 ENCOUNTER — Encounter: Payer: Self-pay | Admitting: Internal Medicine

## 2024-01-19 ENCOUNTER — Other Ambulatory Visit: Payer: Self-pay

## 2024-01-19 ENCOUNTER — Ambulatory Visit
Admission: RE | Admit: 2024-01-19 | Discharge: 2024-01-19 | Disposition: A | Discharge status: Home / Self Care | Payer: Medicare Other | Source: Ambulatory Visit | Attending: Radiation Oncology

## 2024-01-19 DIAGNOSIS — C712 Malignant neoplasm of temporal lobe: Secondary | ICD-10-CM | POA: Diagnosis not present

## 2024-01-19 DIAGNOSIS — Z510 Encounter for antineoplastic radiation therapy: Secondary | ICD-10-CM | POA: Diagnosis not present

## 2024-01-19 LAB — RAD ONC ARIA SESSION SUMMARY
Course Elapsed Days: 21
Plan Fractions Treated to Date: 16
Plan Prescribed Dose Per Fraction: 2 Gy
Plan Total Fractions Prescribed: 23
Plan Total Prescribed Dose: 46 Gy
Reference Point Dosage Given to Date: 32 Gy
Reference Point Session Dosage Given: 2 Gy
Session Number: 16
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
## 2024-01-20 ENCOUNTER — Other Ambulatory Visit: Payer: Self-pay

## 2024-01-20 ENCOUNTER — Encounter: Payer: Self-pay | Admitting: *Deleted

## 2024-01-20 ENCOUNTER — Ambulatory Visit
Admission: RE | Admit: 2024-01-20 | Discharge: 2024-01-20 | Disposition: A | Discharge status: Home / Self Care | Payer: Medicare Other | Source: Ambulatory Visit | Attending: Radiation Oncology

## 2024-01-20 DIAGNOSIS — Z510 Encounter for antineoplastic radiation therapy: Secondary | ICD-10-CM | POA: Diagnosis not present

## 2024-01-20 DIAGNOSIS — C712 Malignant neoplasm of temporal lobe: Secondary | ICD-10-CM | POA: Diagnosis not present

## 2024-01-20 LAB — RAD ONC ARIA SESSION SUMMARY
Course Elapsed Days: 22
Plan Fractions Treated to Date: 17
Plan Prescribed Dose Per Fraction: 2 Gy
Plan Total Fractions Prescribed: 23
Plan Total Prescribed Dose: 46 Gy
Reference Point Dosage Given to Date: 34 Gy
Reference Point Session Dosage Given: 2 Gy
Session Number: 17

## 2024-01-21 ENCOUNTER — Other Ambulatory Visit: Payer: Self-pay

## 2024-01-21 ENCOUNTER — Ambulatory Visit
Admission: RE | Admit: 2024-01-21 | Discharge: 2024-01-21 | Disposition: A | Discharge status: Home / Self Care | Payer: Medicare Other | Source: Ambulatory Visit | Attending: Radiation Oncology

## 2024-01-21 DIAGNOSIS — Z510 Encounter for antineoplastic radiation therapy: Secondary | ICD-10-CM | POA: Diagnosis not present

## 2024-01-21 DIAGNOSIS — C712 Malignant neoplasm of temporal lobe: Secondary | ICD-10-CM | POA: Diagnosis not present

## 2024-01-21 LAB — RAD ONC ARIA SESSION SUMMARY
Course Elapsed Days: 23
Plan Fractions Treated to Date: 18
Plan Prescribed Dose Per Fraction: 2 Gy
Plan Total Fractions Prescribed: 23
Plan Total Prescribed Dose: 46 Gy
Reference Point Dosage Given to Date: 36 Gy
Reference Point Session Dosage Given: 2 Gy
Session Number: 18

## 2024-01-22 ENCOUNTER — Ambulatory Visit
Admission: RE | Admit: 2024-01-22 | Discharge: 2024-01-22 | Disposition: A | Discharge status: Home / Self Care | Payer: Medicare Other | Source: Ambulatory Visit | Attending: Radiation Oncology | Admitting: Radiation Oncology

## 2024-01-22 ENCOUNTER — Other Ambulatory Visit: Payer: Self-pay

## 2024-01-22 ENCOUNTER — Ambulatory Visit
Admission: RE | Admit: 2024-01-22 | Discharge: 2024-01-22 | Discharge status: Home / Self Care | Payer: Medicare Other | Source: Ambulatory Visit | Attending: Radiation Oncology

## 2024-01-22 ENCOUNTER — Inpatient Hospital Stay: Payer: Medicare Other | Admitting: Internal Medicine

## 2024-01-22 ENCOUNTER — Inpatient Hospital Stay: Payer: Medicare Other

## 2024-01-22 VITALS — BP 112/76 | HR 58 | Temp 97.7°F | Resp 18 | Ht 73.0 in | Wt 213.0 lb

## 2024-01-22 DIAGNOSIS — C714 Malignant neoplasm of occipital lobe: Secondary | ICD-10-CM

## 2024-01-22 DIAGNOSIS — C712 Malignant neoplasm of temporal lobe: Secondary | ICD-10-CM | POA: Diagnosis not present

## 2024-01-22 DIAGNOSIS — C719 Malignant neoplasm of brain, unspecified: Secondary | ICD-10-CM

## 2024-01-22 DIAGNOSIS — Z510 Encounter for antineoplastic radiation therapy: Secondary | ICD-10-CM | POA: Diagnosis not present

## 2024-01-22 LAB — CBC WITH DIFFERENTIAL (CANCER CENTER ONLY)
Abs Immature Granulocytes: 0.01 10*3/uL (ref 0.00–0.07)
Basophils Absolute: 0.1 10*3/uL (ref 0.0–0.1)
Basophils Relative: 1 %
Eosinophils Absolute: 0.1 10*3/uL (ref 0.0–0.5)
Eosinophils Relative: 3 %
HCT: 40.4 % (ref 39.0–52.0)
Hemoglobin: 13.1 g/dL (ref 13.0–17.0)
Immature Granulocytes: 0 %
Lymphocytes Relative: 26 %
Lymphs Abs: 1.4 10*3/uL (ref 0.7–4.0)
MCH: 28.5 pg (ref 26.0–34.0)
MCHC: 32.4 g/dL (ref 30.0–36.0)
MCV: 88 fL (ref 80.0–100.0)
Monocytes Absolute: 0.6 10*3/uL (ref 0.1–1.0)
Monocytes Relative: 12 %
Neutro Abs: 3.1 10*3/uL (ref 1.7–7.7)
Neutrophils Relative %: 58 %
Platelet Count: 276 10*3/uL (ref 150–400)
RBC: 4.59 MIL/uL (ref 4.22–5.81)
RDW: 14 % (ref 11.5–15.5)
WBC Count: 5.3 10*3/uL (ref 4.0–10.5)
nRBC: 0 % (ref 0.0–0.2)

## 2024-01-22 LAB — CMP (CANCER CENTER ONLY)
ALT: 38 U/L (ref 0–44)
AST: 35 U/L (ref 15–41)
Albumin: 4.1 g/dL (ref 3.5–5.0)
Alkaline Phosphatase: 91 U/L (ref 38–126)
Anion gap: 4 — ABNORMAL LOW (ref 5–15)
BUN: 11 mg/dL (ref 8–23)
CO2: 29 mmol/L (ref 22–32)
Calcium: 9.4 mg/dL (ref 8.9–10.3)
Chloride: 106 mmol/L (ref 98–111)
Creatinine: 0.92 mg/dL (ref 0.61–1.24)
GFR, Estimated: >60 mL/min (ref >60)
Glucose, Bld: 82 mg/dL (ref 70–99)
Potassium: 4.7 mmol/L (ref 3.5–5.1)
Sodium: 139 mmol/L (ref 135–145)
Total Bilirubin: 0.5 mg/dL (ref 0.0–1.2)
Total Protein: 6.4 g/dL — ABNORMAL LOW (ref 6.5–8.1)

## 2024-01-22 LAB — RAD ONC ARIA SESSION SUMMARY
Course Elapsed Days: 24
Plan Fractions Treated to Date: 19
Plan Prescribed Dose Per Fraction: 2 Gy
Plan Total Fractions Prescribed: 23
Plan Total Prescribed Dose: 46 Gy
Reference Point Dosage Given to Date: 38 Gy
Reference Point Session Dosage Given: 2 Gy
Session Number: 19

## 2024-01-22 NOTE — Progress Notes (Signed)
 [facility] at [facility] [address]  [place], [place] [address] [phone]   Interval Evaluation  Date of Service: 01/22/24 Patient Name: Trevor Contreras Patient MRN: [id number] Patient DOB: 06-Sep-1956 Provider: Henreitta Leber, MD  Identifying Statement:  Trevor Contreras is a 68 y.o. male with right occipital glioblastoma    Oncologic History: Oncology History  Glioblastoma multiforme of occipital lobe (HCC)  11/27/2023 Surgery   Craniotomy, resection of right occipital mass with Dr. Zachery Conch; path is glioblastoma, [facility]   12/29/2023 -  Chemotherapy   Patient is on Treatment Plan : BRAIN GLIOBLASTOMA Radiation Therapy With Concurrent Temozolomide 75 mg/m2 Daily Followed By Sequential Maintenance Temozolomide x 6-12 cycles       Biomarkers:  MGMT Unmethylated.  IDH 1/2 Wild type.  EGFR Unknown  TERT Unknown   Interval History: Trevor Contreras presents today for follow up, now in week 4 of IMRT and Temodar.  No new or progressive neurologic changes today.  Does complain of increased fatigue burden, especially following dosing of seizure medications.  Denies headaches, seizures.   H+P (12/16/23) Patient presented to neurologic attention on 11/09/23 with new onset seizures, characterized as "flashing lights in the left peripheral vision". CNS imaging demonstrated enhancing mass in the right occipital lobe, c/w primary tumor.  He underwent craniotomy, resection with Dr. Zachery Conch at [facility] on 11/27/23.  Following surgery, he has had few issues, mostly normal vision.  Is independent with activities of daily living, no further seizures.  Medications: Current Outpatient Medications on File Prior to Visit  Medication Sig Dispense Refill   diclofenac (VOLTAREN) 75 MG EC tablet Take 75 mg by mouth as needed.     gabapentin (NEURONTIN) 300 MG capsule Take 300 mg by mouth at bedtime.     lacosamide (VIMPAT) 200 MG TABS tablet Take 200 mg by mouth 2 (two) times daily.      levETIRAcetam (KEPPRA) 1000 MG tablet Take 1,000 mg by mouth 2 (two) times daily. Take for 30 days     Melatonin 10 MG CAPS Take 10 mg by mouth as needed (prn at bedtime for sleep).     methocarbamol (ROBAXIN) 500 MG tablet Take 500 mg by mouth 3 (three) times daily.     Multiple Vitamins-Minerals (CENTRUM ADULT PO) Take 1 tablet by mouth daily.     ondansetron (ZOFRAN) 8 MG tablet Take 1 tablet (8 mg total) by mouth every 8 (eight) hours as needed for nausea or vomiting. May take 30-60 minutes prior to Temodar administration if nausea/vomiting occurs as needed. 30 tablet 1   ondansetron (ZOFRAN-ODT) 4 MG disintegrating tablet Take 4 mg by mouth every 8 (eight) hours as needed.     senna-docusate (SENOKOT-S) 8.6-50 MG tablet Take 1 tablet by mouth 2 (two) times daily.     temozolomide (TEMODAR) 140 MG capsule Take 1 capsule (140 mg total) by mouth daily. (Take with one 20 mg capsule for total daily dose of 160 mg). May take on an empty stomach to decrease nausea & vomiting. 42 capsule 0   temozolomide (TEMODAR) 20 MG capsule Take 1 capsule (20 mg total) by mouth daily. (Take with one 140 mg capsule for total daily dose of 160 mg). May take on an empty stomach to decrease nausea & vomiting. 42 capsule 0   No current facility-administered medications on file prior to visit.    Allergies: No Known Allergies Past Medical History:  Past Medical History:  Diagnosis Date   Colon cancer (HCC) 2002  Past Surgical History:  Past Surgical History:  Procedure Laterality Date   COLON SURGERY     COLONOSCOPY     EYE SURGERY     trying to correct blurred vision in left eye; unsuccessful   KNEE ARTHROSCOPY     bilat; several surgeries   SHOULDER ARTHROSCOPY     rotator cuff, reattach part of long bicep, Dr Creed Copper   Social History:  Social History   Socioeconomic History   Marital status: Married    Spouse name: Not on file   Number of children: Not on file   Years of education: Not on file    Highest education level: Not on file  Occupational History   Not on file  Tobacco Use   Smoking status: Never   Smokeless tobacco: Never  Vaping Use   Vaping status: Never Used  Substance and Sexual Activity   Alcohol use: Yes    Comment: 2-3 times weekly, beer   Drug use: No   Sexual activity: Not on file  Other Topics Concern   Not on file  Social History Narrative   Not on file   Social Drivers of Health   Financial Resource Strain: Low Risk  (11/22/2023)   [facility]   Overall Financial Resource Strain (CARDIA)    Difficulty of Paying Living Expenses: Not hard at all  Food Insecurity: No Food Insecurity (11/22/2023)   Received from [facility]   Hunger Vital Sign    Worried About Running Out of Food in the Last Year: Never true    Ran Out of Food in the Last Year: Never true  Transportation Needs: No Transportation Needs (11/22/2023)   Received from [facility] - Transportation    In the past 12 months, has lack of transportation kept you from medical appointments or from getting medications?: No    Lack of Transportation (Non-Medical): No  Physical Activity: Sufficiently Active (11/17/2023)   Received from [facility]   Exercise Vital Sign    Days of Exercise per Week: 5 days    Minutes of Exercise per Session: 60 min  Stress: Stress Concern Present (11/17/2023)   Received from [facility] - Occupational Stress Questionnaire    Feeling of Stress : Very much  Social Connections: Socially Integrated (11/17/2023)   Received from [facility]   Social Network    How would you rate your social network (family, work, friends)?: Good participation with social networks  Intimate Partner Violence: Not At Risk (11/17/2023)   Received from [facility]   HITS    Over the last 12 months how often did your partner physically hurt you?: Never     Over the last 12 months how often did your partner insult you or talk down to you?: Never    Over the last 12 months how often did your partner threaten you with physical harm?: Never    Over the last 12 months how often did your partner scream or curse at you?: Never   Family History:  Family History  Problem Relation Age of Onset   Cancer Mother        breast   Cancer Father        lung   Colon cancer Neg Hx    Colon polyps Neg Hx    Esophageal cancer Neg Hx    Stomach cancer Neg Hx    Rectal cancer  Neg Hx     Review of Systems: Constitutional: Doesn't report fevers, chills or abnormal weight loss Eyes: Doesn't report blurriness of vision Ears, nose, mouth, throat, and face: Doesn't report sore throat Respiratory: Doesn't report cough, dyspnea or wheezes Cardiovascular: Doesn't report palpitation, chest discomfort  Gastrointestinal:  Doesn't report nausea, constipation, diarrhea GU: Doesn't report incontinence Skin: Doesn't report skin rashes Neurological: Per HPI Musculoskeletal: Doesn't report joint pain Behavioral/Psych: Doesn't report anxiety  Physical Exam: Vitals:   01/22/24 0952  BP: 112/76  Pulse: (!) 58  Resp: 18  Temp: 97.7 F (36.5 C)  SpO2: 98%   KPS: 80. General: Alert, cooperative, pleasant, in no acute distress Head: Normal EENT: No conjunctival injection or scleral icterus.  Lungs: Resp effort normal Cardiac: Regular rate Abdomen: Non-distended abdomen Skin: No rashes cyanosis or petechiae. Extremities: No clubbing or edema  Neurologic Exam: Mental Status: Awake, alert, attentive to examiner. Oriented to self and environment. Language is fluent with intact comprehension.  Cranial Nerves: Visual acuity is grossly normal. Visual fields are full. Extra-ocular movements intact. No ptosis. Face is symmetric Motor: Tone and bulk are normal. Power is full in both arms and legs. Reflexes are symmetric, no pathologic reflexes present.  Sensory: Intact  to light touch Gait: Normal.   Labs: I have reviewed the data as listed    Component Value Date/Time   NA 138 01/15/2024 1009   NA 140 10/18/2022 0949   K 4.5 01/15/2024 1009   CL 103 01/15/2024 1009   CO2 31 01/15/2024 1009   GLUCOSE 90 01/15/2024 1009   BUN 12 01/15/2024 1009   BUN 21 10/18/2022 0949   CREATININE 0.89 01/15/2024 1009   CREATININE 0.91 05/31/2013 1310   CALCIUM 9.2 01/15/2024 1009   PROT 6.6 01/15/2024 1009   PROT 6.3 10/18/2022 0949   ALBUMIN 4.0 01/15/2024 1009   ALBUMIN 4.0 10/18/2022 0949   AST 24 01/15/2024 1009   ALT 27 01/15/2024 1009   ALKPHOS 86 01/15/2024 1009   BILITOT 0.6 01/15/2024 1009   GFRNONAA >60 01/15/2024 1009   GFRNONAA >89 05/31/2013 1310   GFRAA 88 06/02/2020 1444   GFRAA >89 05/31/2013 1310   Lab Results  Component Value Date   WBC 5.0 01/15/2024   NEUTROABS 3.0 01/15/2024   HGB 12.9 (L) 01/15/2024   HCT 39.6 01/15/2024   MCV 88.8 01/15/2024   PLT 225 01/15/2024     Assessment/Plan Glioblastoma multiforme of occipital lobe (HCC)  Trevor Contreras is clinically stable today, now having completed 4 weeks of IMRT and concurrent Temodar.    We ultimately recommended continuing with course of intensity modulated radiation therapy and concurrent daily Temozolomide.  Radiation will be administered Mon-Fri over 6 weeks, Temodar will be dosed at 75mg /m2 to be given daily over 42 days.  We reviewed side effects of temodar, including fatigue, nausea/vomiting, constipation, and cytopenias.  Informed consent was verbally obtained at bedside to proceed with oral chemotherapy.  Chemotherapy should be held for the following:  ANC less than 1,000  Platelets less than 100,000  LFT or creatinine greater than 2x ULN  If clinical concerns/contraindications develop  Every 2 weeks during radiation, labs will be checked accompanied by a clinical evaluation in the brain tumor clinic.  Will con't Vimpat 200mg  BID for seizure prevention.  Ok  to stop Keppra given severe fatigue, lack of seizures.  All questions were answered. The patient knows to call the clinic with any problems, questions or concerns. No barriers to learning were detected.  The total time spent in the encounter was 30 minutes and more than 50% was on counseling and review of test results   Henreitta Leber, MD Medical Director of Neuro-Oncology [facility] [place] 01/22/24 9:57 AM

## 2024-01-23 ENCOUNTER — Ambulatory Visit
Admission: RE | Admit: 2024-01-23 | Discharge: 2024-01-23 | Disposition: A | Discharge status: Home / Self Care | Payer: Medicare Other | Source: Ambulatory Visit | Attending: Radiation Oncology | Admitting: Radiation Oncology

## 2024-01-23 ENCOUNTER — Other Ambulatory Visit: Payer: Self-pay

## 2024-01-23 DIAGNOSIS — C712 Malignant neoplasm of temporal lobe: Secondary | ICD-10-CM | POA: Diagnosis not present

## 2024-01-23 DIAGNOSIS — Z510 Encounter for antineoplastic radiation therapy: Secondary | ICD-10-CM | POA: Diagnosis not present

## 2024-01-23 LAB — RAD ONC ARIA SESSION SUMMARY
Course Elapsed Days: 25
Plan Fractions Treated to Date: 20
Plan Prescribed Dose Per Fraction: 2 Gy
Plan Total Fractions Prescribed: 23
Plan Total Prescribed Dose: 46 Gy
Reference Point Dosage Given to Date: 40 Gy
Reference Point Session Dosage Given: 2 Gy
Session Number: 20

## 2024-01-26 ENCOUNTER — Ambulatory Visit
Admission: RE | Admit: 2024-01-26 | Discharge: 2024-01-26 | Disposition: A | Discharge status: Home / Self Care | Payer: Medicare Other | Source: Ambulatory Visit | Attending: Radiation Oncology | Admitting: Radiation Oncology

## 2024-01-26 ENCOUNTER — Other Ambulatory Visit: Payer: Self-pay

## 2024-01-26 DIAGNOSIS — C712 Malignant neoplasm of temporal lobe: Secondary | ICD-10-CM | POA: Diagnosis not present

## 2024-01-26 DIAGNOSIS — Z510 Encounter for antineoplastic radiation therapy: Secondary | ICD-10-CM | POA: Diagnosis not present

## 2024-01-26 LAB — RAD ONC ARIA SESSION SUMMARY
Course Elapsed Days: 28
Plan Fractions Treated to Date: 21
Plan Prescribed Dose Per Fraction: 2 Gy
Plan Total Fractions Prescribed: 23
Plan Total Prescribed Dose: 46 Gy
Reference Point Dosage Given to Date: 42 Gy
Reference Point Session Dosage Given: 2 Gy
Session Number: 21

## 2024-01-27 ENCOUNTER — Ambulatory Visit
Admission: RE | Admit: 2024-01-27 | Discharge: 2024-01-27 | Disposition: A | Discharge status: Home / Self Care | Payer: Medicare Other | Source: Ambulatory Visit | Attending: Radiation Oncology | Admitting: Radiation Oncology

## 2024-01-27 ENCOUNTER — Other Ambulatory Visit: Payer: Self-pay

## 2024-01-27 DIAGNOSIS — C712 Malignant neoplasm of temporal lobe: Secondary | ICD-10-CM | POA: Diagnosis not present

## 2024-01-27 DIAGNOSIS — Z510 Encounter for antineoplastic radiation therapy: Secondary | ICD-10-CM | POA: Diagnosis not present

## 2024-01-27 LAB — RAD ONC ARIA SESSION SUMMARY
Course Elapsed Days: 29
Plan Fractions Treated to Date: 22
Plan Prescribed Dose Per Fraction: 2 Gy
Plan Total Fractions Prescribed: 23
Plan Total Prescribed Dose: 46 Gy
Reference Point Dosage Given to Date: 44 Gy
Reference Point Session Dosage Given: 2 Gy
Session Number: 22

## 2024-01-28 ENCOUNTER — Ambulatory Visit
Admission: RE | Admit: 2024-01-28 | Discharge: 2024-01-28 | Disposition: A | Discharge status: Home / Self Care | Payer: Medicare Other | Source: Ambulatory Visit | Attending: Radiation Oncology | Admitting: Radiation Oncology

## 2024-01-28 ENCOUNTER — Other Ambulatory Visit: Payer: Self-pay

## 2024-01-28 DIAGNOSIS — C712 Malignant neoplasm of temporal lobe: Secondary | ICD-10-CM | POA: Diagnosis not present

## 2024-01-28 DIAGNOSIS — Z510 Encounter for antineoplastic radiation therapy: Secondary | ICD-10-CM | POA: Diagnosis not present

## 2024-01-28 LAB — RAD ONC ARIA SESSION SUMMARY
Course Elapsed Days: 30
Plan Fractions Treated to Date: 23
Plan Prescribed Dose Per Fraction: 2 Gy
Plan Total Fractions Prescribed: 23
Plan Total Prescribed Dose: 46 Gy
Reference Point Dosage Given to Date: 46 Gy
Reference Point Session Dosage Given: 2 Gy
Session Number: 23

## 2024-01-29 ENCOUNTER — Other Ambulatory Visit: Payer: Self-pay | Admitting: *Deleted

## 2024-01-29 ENCOUNTER — Other Ambulatory Visit: Payer: Self-pay

## 2024-01-29 ENCOUNTER — Ambulatory Visit
Admission: RE | Admit: 2024-01-29 | Discharge: 2024-01-29 | Disposition: A | Discharge status: Home / Self Care | Payer: Medicare Other | Source: Ambulatory Visit | Attending: Radiation Oncology | Admitting: Radiation Oncology

## 2024-01-29 DIAGNOSIS — C712 Malignant neoplasm of temporal lobe: Secondary | ICD-10-CM | POA: Diagnosis not present

## 2024-01-29 DIAGNOSIS — C719 Malignant neoplasm of brain, unspecified: Secondary | ICD-10-CM

## 2024-01-29 DIAGNOSIS — Z510 Encounter for antineoplastic radiation therapy: Secondary | ICD-10-CM | POA: Diagnosis not present

## 2024-01-29 LAB — RAD ONC ARIA SESSION SUMMARY
Course Elapsed Days: 31
Plan Fractions Treated to Date: 1
Plan Prescribed Dose Per Fraction: 2 Gy
Plan Total Fractions Prescribed: 7
Plan Total Prescribed Dose: 14 Gy
Reference Point Dosage Given to Date: 2 Gy
Reference Point Session Dosage Given: 2 Gy
Session Number: 24

## 2024-01-29 MED ORDER — ONDANSETRON HCL 8 MG PO TABS: 8.0000 mg | ORAL_TABLET | Freq: Three times a day (TID) | ORAL | 1 refills | Status: DC | PRN

## 2024-01-30 ENCOUNTER — Ambulatory Visit
Admission: RE | Admit: 2024-01-30 | Discharge: 2024-01-30 | Disposition: A | Discharge status: Home / Self Care | Payer: Medicare Other | Source: Ambulatory Visit | Attending: Radiation Oncology | Admitting: Radiation Oncology

## 2024-01-30 ENCOUNTER — Other Ambulatory Visit: Payer: Self-pay

## 2024-01-30 DIAGNOSIS — C712 Malignant neoplasm of temporal lobe: Secondary | ICD-10-CM | POA: Diagnosis not present

## 2024-01-30 DIAGNOSIS — Z510 Encounter for antineoplastic radiation therapy: Secondary | ICD-10-CM | POA: Diagnosis not present

## 2024-01-30 LAB — RAD ONC ARIA SESSION SUMMARY
Course Elapsed Days: 32
Plan Fractions Treated to Date: 2
Plan Prescribed Dose Per Fraction: 2 Gy
Plan Total Fractions Prescribed: 7
Plan Total Prescribed Dose: 14 Gy
Reference Point Dosage Given to Date: 4 Gy
Reference Point Session Dosage Given: 2 Gy
Session Number: 25

## 2024-02-02 ENCOUNTER — Other Ambulatory Visit: Payer: Self-pay

## 2024-02-02 ENCOUNTER — Ambulatory Visit
Admission: RE | Admit: 2024-02-02 | Discharge: 2024-02-02 | Disposition: A | Discharge status: Home / Self Care | Payer: Medicare Other | Source: Ambulatory Visit | Attending: Radiation Oncology | Admitting: Radiation Oncology

## 2024-02-02 ENCOUNTER — Other Ambulatory Visit: Payer: Self-pay | Admitting: *Deleted

## 2024-02-02 DIAGNOSIS — C712 Malignant neoplasm of temporal lobe: Secondary | ICD-10-CM | POA: Insufficient documentation

## 2024-02-02 DIAGNOSIS — Z125 Encounter for screening for malignant neoplasm of prostate: Secondary | ICD-10-CM | POA: Insufficient documentation

## 2024-02-02 DIAGNOSIS — Z510 Encounter for antineoplastic radiation therapy: Secondary | ICD-10-CM | POA: Diagnosis not present

## 2024-02-02 DIAGNOSIS — C714 Malignant neoplasm of occipital lobe: Secondary | ICD-10-CM

## 2024-02-02 LAB — RAD ONC ARIA SESSION SUMMARY
Course Elapsed Days: 35
Plan Fractions Treated to Date: 3
Plan Prescribed Dose Per Fraction: 2 Gy
Plan Total Fractions Prescribed: 7
Plan Total Prescribed Dose: 14 Gy
Reference Point Dosage Given to Date: 6 Gy
Reference Point Session Dosage Given: 2 Gy
Session Number: 26

## 2024-02-03 ENCOUNTER — Ambulatory Visit
Admission: RE | Admit: 2024-02-03 | Discharge: 2024-02-03 | Disposition: A | Discharge status: Home / Self Care | Payer: Medicare Other | Source: Ambulatory Visit | Attending: Radiation Oncology | Admitting: Radiation Oncology

## 2024-02-03 ENCOUNTER — Other Ambulatory Visit: Payer: Self-pay

## 2024-02-03 DIAGNOSIS — Z510 Encounter for antineoplastic radiation therapy: Secondary | ICD-10-CM | POA: Diagnosis not present

## 2024-02-03 DIAGNOSIS — C712 Malignant neoplasm of temporal lobe: Secondary | ICD-10-CM | POA: Diagnosis not present

## 2024-02-03 LAB — RAD ONC ARIA SESSION SUMMARY
Course Elapsed Days: 36
Plan Fractions Treated to Date: 4
Plan Prescribed Dose Per Fraction: 2 Gy
Plan Total Fractions Prescribed: 7
Plan Total Prescribed Dose: 14 Gy
Reference Point Dosage Given to Date: 8 Gy
Reference Point Session Dosage Given: 2 Gy
Session Number: 27

## 2024-02-04 ENCOUNTER — Ambulatory Visit
Admission: RE | Admit: 2024-02-04 | Discharge: 2024-02-04 | Disposition: A | Discharge status: Home / Self Care | Payer: Medicare Other | Source: Ambulatory Visit | Attending: Radiation Oncology

## 2024-02-04 ENCOUNTER — Other Ambulatory Visit: Payer: Self-pay

## 2024-02-04 DIAGNOSIS — Z510 Encounter for antineoplastic radiation therapy: Secondary | ICD-10-CM | POA: Diagnosis not present

## 2024-02-04 DIAGNOSIS — C712 Malignant neoplasm of temporal lobe: Secondary | ICD-10-CM | POA: Diagnosis not present

## 2024-02-04 LAB — RAD ONC ARIA SESSION SUMMARY
Course Elapsed Days: 37
Plan Fractions Treated to Date: 5
Plan Prescribed Dose Per Fraction: 2 Gy
Plan Total Fractions Prescribed: 7
Plan Total Prescribed Dose: 14 Gy
Reference Point Dosage Given to Date: 10 Gy
Reference Point Session Dosage Given: 2 Gy
Session Number: 28

## 2024-02-05 ENCOUNTER — Inpatient Hospital Stay: Payer: Medicare Other | Attending: Internal Medicine

## 2024-02-05 ENCOUNTER — Other Ambulatory Visit: Payer: Self-pay | Admitting: *Deleted

## 2024-02-05 ENCOUNTER — Ambulatory Visit
Admission: RE | Admit: 2024-02-05 | Discharge: 2024-02-05 | Disposition: A | Discharge status: Home / Self Care | Payer: Medicare Other | Source: Ambulatory Visit | Attending: Radiation Oncology | Admitting: Radiation Oncology

## 2024-02-05 ENCOUNTER — Inpatient Hospital Stay: Payer: Medicare Other | Admitting: Internal Medicine

## 2024-02-05 ENCOUNTER — Other Ambulatory Visit: Payer: Self-pay

## 2024-02-05 VITALS — BP 123/85 | HR 77 | Temp 96.7°F | Resp 14 | Wt 204.9 lb

## 2024-02-05 DIAGNOSIS — Z510 Encounter for antineoplastic radiation therapy: Secondary | ICD-10-CM | POA: Diagnosis not present

## 2024-02-05 DIAGNOSIS — Z79630 Long term (current) use of alkylating agent: Secondary | ICD-10-CM | POA: Insufficient documentation

## 2024-02-05 DIAGNOSIS — Z85038 Personal history of other malignant neoplasm of large intestine: Secondary | ICD-10-CM | POA: Insufficient documentation

## 2024-02-05 DIAGNOSIS — C712 Malignant neoplasm of temporal lobe: Secondary | ICD-10-CM | POA: Diagnosis not present

## 2024-02-05 DIAGNOSIS — C719 Malignant neoplasm of brain, unspecified: Secondary | ICD-10-CM

## 2024-02-05 DIAGNOSIS — C714 Malignant neoplasm of occipital lobe: Secondary | ICD-10-CM | POA: Insufficient documentation

## 2024-02-05 DIAGNOSIS — Z79899 Other long term (current) drug therapy: Secondary | ICD-10-CM | POA: Insufficient documentation

## 2024-02-05 DIAGNOSIS — Z923 Personal history of irradiation: Secondary | ICD-10-CM | POA: Insufficient documentation

## 2024-02-05 LAB — CBC WITH DIFFERENTIAL (CANCER CENTER ONLY)
Abs Immature Granulocytes: 0.01 10*3/uL (ref 0.00–0.07)
Basophils Absolute: 0.1 10*3/uL (ref 0.0–0.1)
Basophils Relative: 2 %
Eosinophils Absolute: 0.1 10*3/uL (ref 0.0–0.5)
Eosinophils Relative: 1 %
HCT: 41.2 % (ref 39.0–52.0)
Hemoglobin: 13.5 g/dL (ref 13.0–17.0)
Immature Granulocytes: 0 %
Lymphocytes Relative: 21 %
Lymphs Abs: 1.2 10*3/uL (ref 0.7–4.0)
MCH: 29 pg (ref 26.0–34.0)
MCHC: 32.8 g/dL (ref 30.0–36.0)
MCV: 88.6 fL (ref 80.0–100.0)
Monocytes Absolute: 0.7 10*3/uL (ref 0.1–1.0)
Monocytes Relative: 12 %
Neutro Abs: 3.5 10*3/uL (ref 1.7–7.7)
Neutrophils Relative %: 64 %
Platelet Count: 261 10*3/uL (ref 150–400)
RBC: 4.65 MIL/uL (ref 4.22–5.81)
RDW: 14 % (ref 11.5–15.5)
WBC Count: 5.6 10*3/uL (ref 4.0–10.5)
nRBC: 0 % (ref 0.0–0.2)

## 2024-02-05 LAB — CMP (CANCER CENTER ONLY)
ALT: 36 U/L (ref 0–44)
AST: 29 U/L (ref 15–41)
Albumin: 4.3 g/dL (ref 3.5–5.0)
Alkaline Phosphatase: 74 U/L (ref 38–126)
Anion gap: 7 (ref 5–15)
BUN: 13 mg/dL (ref 8–23)
CO2: 26 mmol/L (ref 22–32)
Calcium: 9.4 mg/dL (ref 8.9–10.3)
Chloride: 105 mmol/L (ref 98–111)
Creatinine: 0.88 mg/dL (ref 0.61–1.24)
GFR, Estimated: >60 mL/min (ref >60)
Glucose, Bld: 101 mg/dL — ABNORMAL HIGH (ref 70–99)
Potassium: 4.5 mmol/L (ref 3.5–5.1)
Sodium: 138 mmol/L (ref 135–145)
Total Bilirubin: 0.7 mg/dL (ref 0.0–1.2)
Total Protein: 7 g/dL (ref 6.5–8.1)

## 2024-02-05 LAB — RAD ONC ARIA SESSION SUMMARY
Course Elapsed Days: 38
Plan Fractions Treated to Date: 6
Plan Prescribed Dose Per Fraction: 2 Gy
Plan Total Fractions Prescribed: 7
Plan Total Prescribed Dose: 14 Gy
Reference Point Dosage Given to Date: 12 Gy
Reference Point Session Dosage Given: 2 Gy
Session Number: 29

## 2024-02-05 MED ORDER — LACOSAMIDE 100 MG PO TABS: 100.0000 mg | ORAL_TABLET | Freq: Two times a day (BID) | ORAL | 2 refills | Status: DC

## 2024-02-05 NOTE — Progress Notes (Signed)
 [facility] [address]  [place], [place] [address] [phone]   Interval Evaluation  Date of Service: 02/05/24 Patient Name: Trevor Contreras Patient MRN: [id number] Patient DOB: 11-24-1956 Provider: Henreitta Leber, MD  Identifying Statement:  Trevor Contreras is a 68 y.o. male with right occipital glioblastoma    Oncologic History: Oncology History  Glioblastoma multiforme of occipital lobe (HCC)  11/27/2023 Surgery   Craniotomy, resection of right occipital mass with Dr. Zachery Conch; path is glioblastoma, [facility]   12/29/2023 -  Chemotherapy   Patient is on Treatment Plan : BRAIN GLIOBLASTOMA Radiation Therapy With Concurrent Temozolomide 75 mg/m2 Daily Followed By Sequential Maintenance Temozolomide x 6-12 cycles       Biomarkers:  MGMT Unmethylated.  IDH 1/2 Wild type.  EGFR Unknown  TERT Unknown   Interval History: Maverick Dieudonne presents today for follow up, now in week 6 of IMRT and Temodar.  Constipation remains an issue with the chemo.  Does complain of increased fatigue burden, especially following dosing of seizure medications.  Denies headaches, seizures.   H+P (12/16/23) Patient presented to neurologic attention on 11/09/23 with new onset seizures, characterized as "flashing lights in the left peripheral vision". CNS imaging demonstrated enhancing mass in the right occipital lobe, c/w primary tumor.  He underwent craniotomy, resection with Dr. Zachery Conch at [facility] on 11/27/23.  Following surgery, he has had few issues, mostly normal vision.  Is independent with activities of daily living, no further seizures.  Medications: Current Outpatient Medications on File Prior to Visit  Medication Sig Dispense Refill   lacosamide (VIMPAT) 200 MG TABS tablet Take 200 mg by mouth 2 (two) times daily.     Melatonin 10 MG CAPS Take 10 mg by mouth as needed (prn at bedtime for sleep).     methocarbamol (ROBAXIN) 500 MG tablet Take 500 mg by mouth 3  (three) times daily.     Multiple Vitamins-Minerals (CENTRUM ADULT PO) Take 1 tablet by mouth daily.     Polyethylene Glycol 3350 (MIRALAX PO) Take 1 tablet by mouth daily as needed.     temozolomide (TEMODAR) 140 MG capsule Take 1 capsule (140 mg total) by mouth daily. (Take with one 20 mg capsule for total daily dose of 160 mg). May take on an empty stomach to decrease nausea & vomiting. 42 capsule 0   temozolomide (TEMODAR) 20 MG capsule Take 1 capsule (20 mg total) by mouth daily. (Take with one 140 mg capsule for total daily dose of 160 mg). May take on an empty stomach to decrease nausea & vomiting. 42 capsule 0   diclofenac (VOLTAREN) 75 MG EC tablet Take 75 mg by mouth as needed. (Patient not taking: Reported on 02/05/2024)     gabapentin (NEURONTIN) 300 MG capsule Take 300 mg by mouth at bedtime. (Patient not taking: Reported on 02/05/2024)     levETIRAcetam (KEPPRA) 1000 MG tablet Take 500 mg by mouth 2 (two) times daily. Take for 30 days     ondansetron (ZOFRAN) 8 MG tablet Take 1 tablet (8 mg total) by mouth every 8 (eight) hours as needed for nausea or vomiting. May take 30-60 minutes prior to Temodar administration if nausea/vomiting occurs as needed. (Patient not taking: Reported on 02/05/2024) 30 tablet 1   ondansetron (ZOFRAN-ODT) 4 MG disintegrating tablet Take 4 mg by mouth every 8 (eight) hours as needed.     senna-docusate (SENOKOT-S) 8.6-50 MG tablet Take 1 tablet by mouth 2 (two) times daily. (Patient  not taking: Reported on 02/05/2024)     No current facility-administered medications on file prior to visit.    Allergies: No Known Allergies Past Medical History:  Past Medical History:  Diagnosis Date   Colon cancer (HCC) 2002   Past Surgical History:  Past Surgical History:  Procedure Laterality Date   COLON SURGERY     COLONOSCOPY     EYE SURGERY     trying to correct blurred vision in left eye; unsuccessful   KNEE ARTHROSCOPY     bilat; several surgeries   SHOULDER  ARTHROSCOPY     rotator cuff, reattach part of long bicep, Dr Creed Copper   Social History:  Social History   Socioeconomic History   Marital status: Married    Spouse name: Not on file   Number of children: Not on file   Years of education: Not on file   Highest education level: Not on file  Occupational History   Not on file  Tobacco Use   Smoking status: Never   Smokeless tobacco: Never  Vaping Use   Vaping status: Never Used  Substance and Sexual Activity   Alcohol use: Yes    Comment: 2-3 times weekly, beer   Drug use: No   Sexual activity: Not on file  Other Topics Concern   Not on file  Social History Narrative   Not on file   Social Drivers of Health   Financial Resource Strain: Low Risk  (11/22/2023)   Received from [facility]   Overall Financial Resource Strain (CARDIA)    Difficulty of Paying Living Expenses: Not hard at all  Food Insecurity: No Food Insecurity (11/22/2023)   Received from [facility]   Hunger Vital Sign    Worried About Running Out of Food in the Last Year: Never true    Ran Out of Food in the Last Year: Never true  Transportation Needs: No Transportation Needs (11/22/2023)   Received from [facility] - Transportation    In the past 12 months, has lack of transportation kept you from medical appointments or from getting medications?: No    Lack of Transportation (Non-Medical): No  Physical Activity: Sufficiently Active (11/17/2023)   Received from [facility]   Exercise Vital Sign    Days of Exercise per Week: 5 days    Minutes of Exercise per Session: 60 min  Stress: Stress Concern Present (11/17/2023)   Received from [facility] - Occupational Stress Questionnaire    Feeling of Stress : Very much  Social Connections: Socially Integrated (11/17/2023)   Received from [facility]   Social Network    How would you rate your  social network (family, work, friends)?: Good participation with social networks  Intimate Partner Violence: Not At Risk (11/17/2023)   Received from [facility]   HITS    Over the last 12 months how often did your partner physically hurt you?: Never    Over the last 12 months how often did your partner insult you or talk down to you?: Never    Over the last 12 months how often did your partner threaten you with physical harm?: Never    Over the last 12 months how often did your partner scream or curse at you?: Never   Family History:  Family History  Problem Relation Age of Onset   Cancer Mother        breast  Cancer Father        lung   Colon cancer Neg Hx    Colon polyps Neg Hx    Esophageal cancer Neg Hx    Stomach cancer Neg Hx    Rectal cancer Neg Hx     Review of Systems: Constitutional: Doesn't report fevers, chills or abnormal weight loss Eyes: Doesn't report blurriness of vision Ears, nose, mouth, throat, and face: Doesn't report sore throat Respiratory: Doesn't report cough, dyspnea or wheezes Cardiovascular: Doesn't report palpitation, chest discomfort  Gastrointestinal:  Doesn't report nausea, constipation, diarrhea GU: Doesn't report incontinence Skin: Doesn't report skin rashes Neurological: Per HPI Musculoskeletal: Doesn't report joint pain Behavioral/Psych: Doesn't report anxiety  Physical Exam: Vitals:   02/05/24 0933  BP: 123/85  Pulse: 77  Resp: 14  Temp: (!) 96.7 F (35.9 C)  SpO2: 98%   KPS: 80. General: Alert, cooperative, pleasant, in no acute distress Head: Normal EENT: No conjunctival injection or scleral icterus.  Lungs: Resp effort normal Cardiac: Regular rate Abdomen: Non-distended abdomen Skin: No rashes cyanosis or petechiae. Extremities: No clubbing or edema  Neurologic Exam: Mental Status: Awake, alert, attentive to examiner. Oriented to self and environment. Language is fluent with intact comprehension.  Cranial Nerves:  Visual acuity is grossly normal. Visual fields are full. Extra-ocular movements intact. No ptosis. Face is symmetric Motor: Tone and bulk are normal. Power is full in both arms and legs. Reflexes are symmetric, no pathologic reflexes present.  Sensory: Intact to light touch Gait: Normal.   Labs: I have reviewed the data as listed    Component Value Date/Time   NA 139 01/22/2024 0937   NA 140 10/18/2022 0949   K 4.7 01/22/2024 0937   CL 106 01/22/2024 0937   CO2 29 01/22/2024 0937   GLUCOSE 82 01/22/2024 0937   BUN 11 01/22/2024 0937   BUN 21 10/18/2022 0949   CREATININE 0.92 01/22/2024 0937   CREATININE 0.91 05/31/2013 1310   CALCIUM 9.4 01/22/2024 0937   PROT 6.4 (L) 01/22/2024 0937   PROT 6.3 10/18/2022 0949   ALBUMIN 4.1 01/22/2024 0937   ALBUMIN 4.0 10/18/2022 0949   AST 35 01/22/2024 0937   ALT 38 01/22/2024 0937   ALKPHOS 91 01/22/2024 0937   BILITOT 0.5 01/22/2024 0937   GFRNONAA >60 01/22/2024 0937   GFRNONAA >89 05/31/2013 1310   GFRAA 88 06/02/2020 1444   GFRAA >89 05/31/2013 1310   Lab Results  Component Value Date   WBC 5.6 02/05/2024   NEUTROABS 3.5 02/05/2024   HGB 13.5 02/05/2024   HCT 41.2 02/05/2024   MCV 88.6 02/05/2024   PLT 261 02/05/2024     Assessment/Plan Glioblastoma multiforme of occipital lobe (HCC)  Timmothy Baranowski is clinically stable today, now having completed 6 weeks of IMRT and concurrent Temodar.    We ultimately recommended completing course of intensity modulated radiation therapy and concurrent daily Temozolomide.  Radiation will be administered Mon-Fri over 6 weeks, Temodar will be dosed at 75mg /m2 to be given daily over 42 days.  We reviewed side effects of temodar, including fatigue, nausea/vomiting, constipation, and cytopenias.  Informed consent was verbally obtained at bedside to proceed with oral chemotherapy.  Chemotherapy should be held for the following:  ANC less than 1,000  Platelets less than 100,000  LFT or  creatinine greater than 2x ULN  If clinical concerns/contraindications develop  Every 2 weeks during radiation, labs will be checked accompanied by a clinical evaluation in the brain tumor clinic.  Agreeable with  trial of Vimpat 100mg  BID, reduced from 200mg  BID.  He understands increased risk of breakthrough seizures.  We ask that Devanta Daniel return to clinic in 1 months following next brain MRI, or sooner as needed.  All questions were answered. The patient knows to call the clinic with any problems, questions or concerns. No barriers to learning were detected.  The total time spent in the encounter was 30 minutes and more than 50% was on counseling and review of test results   Henreitta Leber, MD Medical Director of Neuro-Oncology [facility] [place] [facility] 02/05/24 9:55 AM

## 2024-02-06 ENCOUNTER — Telehealth: Payer: Self-pay | Admitting: Internal Medicine

## 2024-02-06 ENCOUNTER — Other Ambulatory Visit: Payer: Self-pay

## 2024-02-06 ENCOUNTER — Ambulatory Visit
Admission: RE | Admit: 2024-02-06 | Discharge: 2024-02-06 | Disposition: A | Discharge status: Home / Self Care | Payer: Medicare Other | Source: Ambulatory Visit | Attending: Radiation Oncology | Admitting: Radiation Oncology

## 2024-02-06 ENCOUNTER — Ambulatory Visit
Admission: RE | Admit: 2024-02-06 | Discharge: 2024-02-06 | Disposition: A | Discharge status: Home / Self Care | Source: Ambulatory Visit | Attending: Radiation Oncology | Admitting: Radiation Oncology

## 2024-02-06 ENCOUNTER — Other Ambulatory Visit (HOSPITAL_COMMUNITY): Payer: Self-pay

## 2024-02-06 DIAGNOSIS — C712 Malignant neoplasm of temporal lobe: Secondary | ICD-10-CM | POA: Diagnosis not present

## 2024-02-06 DIAGNOSIS — Z510 Encounter for antineoplastic radiation therapy: Secondary | ICD-10-CM | POA: Diagnosis not present

## 2024-02-06 LAB — RAD ONC ARIA SESSION SUMMARY
Course Elapsed Days: 39
Plan Fractions Treated to Date: 7
Plan Prescribed Dose Per Fraction: 2 Gy
Plan Total Fractions Prescribed: 7
Plan Total Prescribed Dose: 14 Gy
Reference Point Dosage Given to Date: 14 Gy
Reference Point Session Dosage Given: 2 Gy
Session Number: 30

## 2024-02-06 LAB — PSA, TOTAL AND FREE
PSA, Free Pct: 21.7 %
PSA, Free: 0.26 ng/mL
Prostate Specific Ag, Serum: 1.2 ng/mL (ref 0.0–4.0)

## 2024-02-06 NOTE — Telephone Encounter (Signed)
 Patient Scheduled appts. Patient is aware of all appt details.

## 2024-02-09 NOTE — Radiation Completion Notes (Signed)
  Radiation Oncology         [phone] ________________________________  Name: Trevor Contreras MRN: [id number]  Date of Service: 02/06/2024  DOB: 03/05/1956  End of Treatment Note    Diagnosis:  Glioblastoma of the right temporal/occipital region   Intent: Curative     ==========DELIVERED PLANS==========  First Treatment Date: 2023-12-29 Last Treatment Date: 2024-02-06   Plan Name: Brain_Init Site: Brain Technique: IMRT Mode: Photon Dose Per Fraction: 2 Gy Prescribed Dose (Delivered / Prescribed): 46 Gy / 46 Gy Prescribed Fxs (Delivered / Prescribed): 23 / 23   Plan Name: Brain_Bst Site: Brain Technique: IMRT Mode: Photon Dose Per Fraction: 2 Gy Prescribed Dose (Delivered / Prescribed): 14 Gy / 14 Gy Prescribed Fxs (Delivered / Prescribed): 7 / 7     ==========ON TREATMENT VISIT DATES========== 2024-01-02, 2024-01-09, 2024-01-15, 2024-01-22, 2024-01-30, 2024-02-06    See weekly On Treatment Notes in Epic for details in the Media tab (listed as Progress notes on the On Treatment Visit Dates listed above). The patient tolerated radiation. He developed fatigue and anticipated skin changes in the treatment field as well as some nausea and headache during therapy.   The patient will receive a call in about one month from the radiation oncology department. He will continue follow up with Dr. Barbaraann Cao as well.      Osker Mason, PAC

## 2024-02-24 ENCOUNTER — Other Ambulatory Visit (HOSPITAL_COMMUNITY): Payer: Self-pay

## 2024-02-25 DIAGNOSIS — C719 Malignant neoplasm of brain, unspecified: Secondary | ICD-10-CM | POA: Diagnosis not present

## 2024-02-25 DIAGNOSIS — D496 Neoplasm of unspecified behavior of brain: Secondary | ICD-10-CM | POA: Diagnosis not present

## 2024-02-25 DIAGNOSIS — C220 Liver cell carcinoma: Secondary | ICD-10-CM | POA: Diagnosis not present

## 2024-02-26 ENCOUNTER — Other Ambulatory Visit: Payer: Self-pay | Admitting: *Deleted

## 2024-02-26 ENCOUNTER — Encounter: Payer: Self-pay | Admitting: Internal Medicine

## 2024-02-26 ENCOUNTER — Inpatient Hospital Stay
Admission: RE | Admit: 2024-02-26 | Discharge: 2024-02-26 | Disposition: A | Discharge status: Home / Self Care | Payer: Self-pay | Source: Ambulatory Visit | Attending: Internal Medicine

## 2024-02-26 ENCOUNTER — Telehealth: Payer: Self-pay | Admitting: *Deleted

## 2024-02-26 DIAGNOSIS — C719 Malignant neoplasm of brain, unspecified: Secondary | ICD-10-CM

## 2024-02-26 NOTE — Telephone Encounter (Signed)
 Brain MRI done at [facility] 02/25/24, images requested.

## 2024-02-27 DIAGNOSIS — C712 Malignant neoplasm of temporal lobe: Secondary | ICD-10-CM | POA: Diagnosis not present

## 2024-03-01 ENCOUNTER — Other Ambulatory Visit (HOSPITAL_COMMUNITY): Payer: Self-pay

## 2024-03-01 ENCOUNTER — Inpatient Hospital Stay

## 2024-03-01 ENCOUNTER — Inpatient Hospital Stay (HOSPITAL_BASED_OUTPATIENT_CLINIC_OR_DEPARTMENT_OTHER): Admitting: Internal Medicine

## 2024-03-01 ENCOUNTER — Encounter: Payer: Self-pay | Admitting: Internal Medicine

## 2024-03-01 ENCOUNTER — Other Ambulatory Visit: Payer: Self-pay

## 2024-03-01 ENCOUNTER — Other Ambulatory Visit: Payer: Self-pay | Admitting: Pharmacy Technician

## 2024-03-01 ENCOUNTER — Telehealth: Payer: Self-pay | Admitting: Pharmacist

## 2024-03-01 ENCOUNTER — Telehealth: Payer: Self-pay | Admitting: Pharmacy Technician

## 2024-03-01 VITALS — BP 118/75 | HR 51 | Temp 97.4°F | Resp 18 | Ht 73.0 in | Wt 200.1 lb

## 2024-03-01 DIAGNOSIS — Z79630 Long term (current) use of alkylating agent: Secondary | ICD-10-CM | POA: Insufficient documentation

## 2024-03-01 DIAGNOSIS — Z85038 Personal history of other malignant neoplasm of large intestine: Secondary | ICD-10-CM | POA: Insufficient documentation

## 2024-03-01 DIAGNOSIS — C714 Malignant neoplasm of occipital lobe: Secondary | ICD-10-CM | POA: Diagnosis not present

## 2024-03-01 DIAGNOSIS — Z79899 Other long term (current) drug therapy: Secondary | ICD-10-CM | POA: Insufficient documentation

## 2024-03-01 DIAGNOSIS — Z923 Personal history of irradiation: Secondary | ICD-10-CM | POA: Diagnosis not present

## 2024-03-01 DIAGNOSIS — C719 Malignant neoplasm of brain, unspecified: Secondary | ICD-10-CM

## 2024-03-01 LAB — CMP (CANCER CENTER ONLY)
ALT: 21 U/L (ref 0–44)
AST: 21 U/L (ref 15–41)
Albumin: 4.2 g/dL (ref 3.5–5.0)
Alkaline Phosphatase: 75 U/L (ref 38–126)
Anion gap: 5 (ref 5–15)
BUN: 11 mg/dL (ref 8–23)
CO2: 28 mmol/L (ref 22–32)
Calcium: 9.3 mg/dL (ref 8.9–10.3)
Chloride: 107 mmol/L (ref 98–111)
Creatinine: 0.89 mg/dL (ref 0.61–1.24)
GFR, Estimated: >60 mL/min (ref >60)
Glucose, Bld: 98 mg/dL (ref 70–99)
Potassium: 4.4 mmol/L (ref 3.5–5.1)
Sodium: 140 mmol/L (ref 135–145)
Total Bilirubin: 0.6 mg/dL (ref 0.0–1.2)
Total Protein: 6.7 g/dL (ref 6.5–8.1)

## 2024-03-01 LAB — CBC WITH DIFFERENTIAL (CANCER CENTER ONLY)
Abs Immature Granulocytes: 0.01 10*3/uL (ref 0.00–0.07)
Basophils Absolute: 0.1 10*3/uL (ref 0.0–0.1)
Basophils Relative: 3 %
Eosinophils Absolute: 0.2 10*3/uL (ref 0.0–0.5)
Eosinophils Relative: 5 %
HCT: 39.6 % (ref 39.0–52.0)
Hemoglobin: 13.1 g/dL (ref 13.0–17.0)
Immature Granulocytes: 0 %
Lymphocytes Relative: 30 %
Lymphs Abs: 1.3 10*3/uL (ref 0.7–4.0)
MCH: 29.2 pg (ref 26.0–34.0)
MCHC: 33.1 g/dL (ref 30.0–36.0)
MCV: 88.2 fL (ref 80.0–100.0)
Monocytes Absolute: 0.5 10*3/uL (ref 0.1–1.0)
Monocytes Relative: 12 %
Neutro Abs: 2.2 10*3/uL (ref 1.7–7.7)
Neutrophils Relative %: 50 %
Platelet Count: 192 10*3/uL (ref 150–400)
RBC: 4.49 MIL/uL (ref 4.22–5.81)
RDW: 13.8 % (ref 11.5–15.5)
WBC Count: 4.3 10*3/uL (ref 4.0–10.5)
nRBC: 0 % (ref 0.0–0.2)

## 2024-03-01 MED ORDER — TEMOZOLOMIDE 100 MG PO CAPS
200.0000 mg | ORAL_CAPSULE | Freq: Every day | ORAL | 0 refills | Status: DC
  Filled 2024-03-01: qty 10, 5d supply, fill #0

## 2024-03-01 MED ORDER — TEMOZOLOMIDE 140 MG PO CAPS
140.0000 mg | ORAL_CAPSULE | Freq: Every day | ORAL | 0 refills | Status: DC
  Filled 2024-03-01: qty 5, 5d supply, fill #0

## 2024-03-01 MED ORDER — TEMOZOLOMIDE 100 MG PO CAPS
200.0000 mg | ORAL_CAPSULE | Freq: Every day | ORAL | 0 refills | Status: DC
Start: 2024-03-01 — End: 2024-03-01
  Filled 2024-03-01: qty 14, 7d supply, fill #0

## 2024-03-01 NOTE — Telephone Encounter (Signed)
 Oral Chemotherapy Pharmacist Encounter  I spoke with patient's wife, Trevor Contreras, for overview of: Temodar (temozolomide) for the maintenance treatment of glioblastoma multiforme, planned duration 6-12 months of treatment.  Counseled on administration, dosing, side effects, monitoring, drug-food interactions, safe handling, storage, and disposal.  Patient will take Temodar 140mg  capsules and Temodar 100mg  capsules, 340mg  total daily dose, by mouth once daily, may take at bedtime and on an empty stomach to decrease nausea and vomiting.  Patient will take Temodar daily for 5 days on, 23 days off, and repeated.  Temodar start date: 03/08/24 PM   Adverse effects include but are not limited to: nausea, vomiting, GI upset, rash, and fatigue. N/V PPX: Patient will take Zofran 8mg  tablet, 1 tablet by mouth 30-60 min prior to Temodar dose to help decrease N/V.  Reviewed importance of keeping a medication schedule and plan for any missed doses. No barriers to medication adherence identified.  Medication reconciliation performed and medication/allergy list updated.  All questions answered.  Trevor Contreras voiced understanding and appreciation.   Medication education handout placed in mail for patient. Patient knows to call the office with questions or concerns. Oral Chemotherapy Clinic phone number provided to patient.   Sherry Ruffing, PharmD, BCPS, BCOP Hematology/Oncology Clinical Pharmacist [facility] Oral Chemotherapy Navigation Clinics [phone] 03/01/2024 1:58 PM

## 2024-03-01 NOTE — Progress Notes (Signed)
 Specialty Pharmacy Initial Fill Coordination Note  Trevor Contreras is a 68 y.o. male contacted today regarding refills of specialty medication(s) Temozolomide (TEMODAR) .  Patient requested Delivery  on 03/02/24  to verified address [address]   [place] [address]   Medication will be filled on 03/01/24.   Patient is aware of $0 copayment.

## 2024-03-01 NOTE — Telephone Encounter (Signed)
 Oral Oncology Patient Advocate Encounter  After completing a benefits investigation, prior authorization for temozolomide is not required at this time through [facility]  Patient's copay is $0.     Omer Jack, CPhT-Adv Oncology Pharmacy Patient Advocate [facility]  Direct Number: [phone]  Fax: [phone]

## 2024-03-01 NOTE — Telephone Encounter (Signed)
 Oral Oncology Pharmacist Encounter  Received new prescription for Temodar (temozolomide) for the maintenance treatment of GBM, planned duration 6-12 cycles.  CBC w/ Diff and CMP from 03/01/24 assessed, no relevant lab abnormalities requiring baseline dose adjustment required at this time. Prescription dose and frequency assessed for appropriateness.  Current medication list in Epic reviewed, no relevant/significant DDIs with Temodar identified.  Evaluated chart and no patient barriers to medication adherence noted.   Patient agreement for treatment documented in MD note on 03/01/24.  Prescription has been e-scribed to the [facility] for benefits analysis and approval.  Oral Oncology Clinic will continue to follow for insurance authorization, copayment issues, initial counseling and start date.  Sherry Ruffing, PharmD, BCPS, BCOP Hematology/Oncology Clinical Pharmacist [facility] Oral Chemotherapy Navigation Clinics [phone] 03/01/2024 12:27 PM

## 2024-03-01 NOTE — Progress Notes (Signed)
 Oral Chemotherapy Pharmacist Encounter  Patient was counseled under telephone encounter from 03/01/24.  Sherry Ruffing, PharmD, BCPS, BCOP Hematology/Oncology Clinical Pharmacist [facility] Oral Chemotherapy Navigation Clinics [phone] 03/01/2024 2:08 PM

## 2024-03-01 NOTE — Progress Notes (Signed)
 [facility] [address]  [place], [place] [address] [phone]   Interval Evaluation  Date of Service: 03/01/24 Patient Name: Trevor Contreras Patient MRN: [id number] Patient DOB: 1956/11/03 Provider: Henreitta Leber, MD  Identifying Statement:  Trevor Contreras is a 68 y.o. male with right occipital glioblastoma    Oncologic History: Oncology History  Glioblastoma multiforme of occipital lobe (HCC)  11/27/2023 Surgery   Craniotomy, resection of right occipital mass with Dr. Zachery Conch; path is glioblastoma, [facility]   12/29/2023 -  Chemotherapy   Patient is on Treatment Plan : BRAIN GLIOBLASTOMA Radiation Therapy With Concurrent Temozolomide 75 mg/m2 Daily Followed By Sequential Maintenance Temozolomide x 6-12 cycles       Biomarkers:  MGMT Unmethylated.  IDH 1/2 Wild type.  EGFR Unknown  TERT Unknown   Interval History: Trevor Contreras presents today for follow up, now having completed radiation and Temodar, recent MRI brain.  No new or progressive changes today.  Doing ok with the Vimpat 100mg  BID right now.  Denies headaches, seizures.  Had recent visit with Dr. Laural Benes at [facility].  H+P (12/16/23) Patient presented to neurologic attention on 11/09/23 with new onset seizures, characterized as "flashing lights in the left peripheral vision". CNS imaging demonstrated enhancing mass in the right occipital lobe, c/w primary tumor.  He underwent craniotomy, resection with Dr. Zachery Conch at [facility] on 11/27/23.  Following surgery, he has had few issues, mostly normal vision.  Is independent with activities of daily living, no further seizures.  Medications: Current Outpatient Medications on File Prior to Visit  Medication Sig Dispense Refill   Lacosamide (VIMPAT) 100 MG TABS Take 1 tablet (100 mg total) by mouth in the morning and at bedtime. 60 tablet 2   ondansetron (ZOFRAN) 8 MG tablet Take 1 tablet (8 mg total) by mouth every 8 (eight) hours as needed for  nausea or vomiting. May take 30-60 minutes prior to Temodar administration if nausea/vomiting occurs as needed. (Patient not taking: Reported on 03/01/2024) 30 tablet 1   No current facility-administered medications on file prior to visit.    Allergies: No Known Allergies Past Medical History:  Past Medical History:  Diagnosis Date   Colon cancer (HCC) 2002   Past Surgical History:  Past Surgical History:  Procedure Laterality Date   COLON SURGERY     COLONOSCOPY     EYE SURGERY     trying to correct blurred vision in left eye; unsuccessful   KNEE ARTHROSCOPY     bilat; several surgeries   SHOULDER ARTHROSCOPY     rotator cuff, reattach part of long bicep, Dr Creed Copper   Social History:  Social History   Socioeconomic History   Marital status: Married    Spouse name: Not on file   Number of children: Not on file   Years of education: Not on file   Highest education level: Not on file  Occupational History   Not on file  Tobacco Use   Smoking status: Never   Smokeless tobacco: Never  Vaping Use   Vaping status: Never Used  Substance and Sexual Activity   Alcohol use: Yes    Comment: 2-3 times weekly, beer   Drug use: No   Sexual activity: Not on file  Other Topics Concern   Not on file  Social History Narrative   Not on file   Social Drivers of Health   Financial Resource Strain: Low Risk  (11/22/2023)   Received from [facility]   Overall Financial Resource Strain (CARDIA)    Difficulty of Paying Living Expenses: Not hard at all  Food Insecurity: No Food Insecurity (11/22/2023)   Received from [facility]   Hunger Vital Sign    Worried About Running Out of Food in the Last Year: Never true    Ran Out of Food in the Last Year: Never true  Transportation Needs: No Transportation Needs (11/22/2023)   Received from [facility] - Transportation    In the past 12 months, has lack of transportation  kept you from medical appointments or from getting medications?: No    Lack of Transportation (Non-Medical): No  Physical Activity: Sufficiently Active (11/17/2023)   Received from [facility]   Exercise Vital Sign    Days of Exercise per Week: 5 days    Minutes of Exercise per Session: 60 min  Stress: Stress Concern Present (11/17/2023)   Received from [facility] of Occupational Health - Occupational Stress Questionnaire    Feeling of Stress : Very much  Social Connections: Socially Integrated (11/17/2023)   Received from [facility]   Social Network    How would you rate your social network (family, work, friends)?: Good participation with social networks  Intimate Partner Violence: Not At Risk (11/17/2023)   Received from [facility]   HITS    Over the last 12 months how often did your partner physically hurt you?: Never    Over the last 12 months how often did your partner insult you or talk down to you?: Never    Over the last 12 months how often did your partner threaten you with physical harm?: Never    Over the last 12 months how often did your partner scream or curse at you?: Never   Family History:  Family History  Problem Relation Age of Onset   Cancer Mother        breast   Cancer Father        lung   Colon cancer Neg Hx    Colon polyps Neg Hx    Esophageal cancer Neg Hx    Stomach cancer Neg Hx    Rectal cancer Neg Hx     Review of Systems: Constitutional: Doesn't report fevers, chills or abnormal weight loss Eyes: Doesn't report blurriness of vision Ears, nose, mouth, throat, and face: Doesn't report sore throat Respiratory: Doesn't report cough, dyspnea or wheezes Cardiovascular: Doesn't report palpitation, chest discomfort  Gastrointestinal:  Doesn't report nausea, constipation, diarrhea GU: Doesn't report incontinence Skin: Doesn't report skin rashes Neurological: Per HPI Musculoskeletal: Doesn't report joint  pain Behavioral/Psych: Doesn't report anxiety  Physical Exam: Vitals:   03/01/24 0941  BP: 118/75  Pulse: (!) 51  Resp: 18  Temp: (!) 97.4 F (36.3 C)  SpO2: 100%   KPS: 80. General: Alert, cooperative, pleasant, in no acute distress Head: Normal EENT: No conjunctival injection or scleral icterus.  Lungs: Resp effort normal Cardiac: Regular rate Abdomen: Non-distended abdomen Skin: No rashes cyanosis or petechiae. Extremities: No clubbing or edema  Neurologic Exam: Mental Status: Awake, alert, attentive to examiner. Oriented to self and environment. Language is fluent with intact comprehension.  Cranial Nerves: Visual acuity is grossly normal. Visual fields are full. Extra-ocular movements intact. No ptosis. Face is symmetric Motor: Tone and bulk are normal. Power is full in both arms and legs. Reflexes are symmetric, no pathologic reflexes present.  Sensory: Intact to light touch  Gait: Normal.   Labs: I have reviewed the data as listed    Component Value Date/Time   NA 138 02/05/2024 0917   NA 140 10/18/2022 0949   K 4.5 02/05/2024 0917   CL 105 02/05/2024 0917   CO2 26 02/05/2024 0917   GLUCOSE 101 (H) 02/05/2024 0917   BUN 13 02/05/2024 0917   BUN 21 10/18/2022 0949   CREATININE 0.88 02/05/2024 0917   CREATININE 0.91 05/31/2013 1310   CALCIUM 9.4 02/05/2024 0917   PROT 7.0 02/05/2024 0917   PROT 6.3 10/18/2022 0949   ALBUMIN 4.3 02/05/2024 0917   ALBUMIN 4.0 10/18/2022 0949   AST 29 02/05/2024 0917   ALT 36 02/05/2024 0917   ALKPHOS 74 02/05/2024 0917   BILITOT 0.7 02/05/2024 0917   GFRNONAA >60 02/05/2024 0917   GFRNONAA >89 05/31/2013 1310   GFRAA 88 06/02/2020 1444   GFRAA >89 05/31/2013 1310   Lab Results  Component Value Date   WBC 4.3 03/01/2024   NEUTROABS 2.2 03/01/2024   HGB 13.1 03/01/2024   HCT 39.6 03/01/2024   MCV 88.2 03/01/2024   PLT 192 03/01/2024    Imaging:  [facility] Clinician Interpretation: I have personally reviewed the CNS  images as listed.  My interpretation, in the context of the patient's clinical presentation, is expected post-RT changes,  pending read  No results found.  Assessment/Plan Glioblastoma multiforme of occipital lobe (HCC)  Tyjae Issa is clinically stable today, now having completed 6 weeks of IMRT and concurrent Temodar.  MRI brain from [facility] demonstrated localized changes c/w post-RT inflammation or early disease progression.  We recommended initiating treatment with cycle #1 Temozolomide 150 mg/m2, on for five days and off for twenty three days in twenty eight day cycles. The patient will have a complete blood count performed on days 21 and 28 of each cycle, and a comprehensive metabolic panel performed on day 28 of each cycle. Labs may need to be performed more often. Zofran will prescribed for home use for nausea/vomiting.   Informed consent was obtained verbally at bedside to proceed with oral chemotherapy.  Chemotherapy should be held for the following:  ANC less than 1,000  Platelets less than 100,000  LFT or creatinine greater than 2x ULN  If clinical concerns/contraindications develop  Ok to continue Vimpat 100mg  BID.  We ask that Wyley Hack return to clinic in 1 month with labs prior to cycle #2, or sooner as needed.  All questions were answered. The patient knows to call the clinic with any problems, questions or concerns. No barriers to learning were detected.  The total time spent in the encounter was 40 minutes and more than 50% was on counseling and review of test results   Henreitta Leber, MD Medical Director of Neuro-Oncology [facility] at [facility] 03/01/24 10:02 AM

## 2024-03-02 ENCOUNTER — Other Ambulatory Visit: Payer: Self-pay

## 2024-03-02 ENCOUNTER — Encounter: Payer: Self-pay | Admitting: Internal Medicine

## 2024-03-02 ENCOUNTER — Other Ambulatory Visit: Payer: Self-pay | Admitting: *Deleted

## 2024-03-02 DIAGNOSIS — D496 Neoplasm of unspecified behavior of brain: Secondary | ICD-10-CM | POA: Diagnosis not present

## 2024-03-02 DIAGNOSIS — C719 Malignant neoplasm of brain, unspecified: Secondary | ICD-10-CM

## 2024-03-02 MED ORDER — ONDANSETRON HCL 8 MG PO TABS: 8.0000 mg | ORAL_TABLET | Freq: Three times a day (TID) | ORAL | 1 refills | Status: DC | PRN

## 2024-03-04 ENCOUNTER — Other Ambulatory Visit: Payer: Medicare Other

## 2024-03-05 NOTE — Progress Notes (Addendum)
  Radiation Oncology         [phone] ________________________________  Name: Trevor Contreras MRN: [id number]  Date of Service: 03/05/2024  DOB: 1956-01-31  Post Treatment Telephone Note  Diagnosis:  Glioblastoma of the right temporal/occipital region (as documented in provider EOT note)  The patient was available for call today.  The patient did  note fatigue during radiation, but is improving. The patient did  note hair loss or skin changes in the field of radiation during therapy, but is improving. The patient is not taking dexamethasone. The patient does not have symptoms of  weakness or loss of control of the extremities. The patient does have symptoms of headache occasionally, mild 2/10. The patient does not have symptoms of seizure or uncontrolled movement. The patient does not have symptoms of changes in vision. The patient does not have changes in speech. The patient does not have confusion.   Patient reports 2 episodes of warmth/ numbness to the entire LT side, visual focus loss, and shaking of the LT arm, lasting roughly 8-10 seconds, post radiation treatments. Dr. Barbaraann Cao is aware via 'In Basket' messaging.  The patient was counseled that he will be contacted by our brain and spine navigator to schedule surveillance imaging. The patient was encouraged to call if he has not received a call to schedule imaging, or if he develops concerns or questions regarding radiation. The patient will also continue to follow up with Dr. Barbaraann Cao in medical oncology.   This concludes the interaction.  [name], LPN

## 2024-03-08 ENCOUNTER — Ambulatory Visit
Admission: RE | Admit: 2024-03-08 | Discharge: 2024-03-08 | Disposition: A | Discharge status: Home / Self Care | Source: Ambulatory Visit | Attending: Internal Medicine | Admitting: Internal Medicine

## 2024-03-08 DIAGNOSIS — Z510 Encounter for antineoplastic radiation therapy: Secondary | ICD-10-CM | POA: Insufficient documentation

## 2024-03-08 DIAGNOSIS — Z125 Encounter for screening for malignant neoplasm of prostate: Secondary | ICD-10-CM | POA: Insufficient documentation

## 2024-03-08 DIAGNOSIS — C712 Malignant neoplasm of temporal lobe: Secondary | ICD-10-CM | POA: Insufficient documentation

## 2024-03-24 ENCOUNTER — Other Ambulatory Visit: Payer: Self-pay

## 2024-03-26 ENCOUNTER — Encounter: Payer: Self-pay | Admitting: Internal Medicine

## 2024-04-05 ENCOUNTER — Other Ambulatory Visit: Payer: Self-pay

## 2024-04-05 ENCOUNTER — Inpatient Hospital Stay: Admitting: Internal Medicine

## 2024-04-05 ENCOUNTER — Other Ambulatory Visit (HOSPITAL_COMMUNITY): Payer: Self-pay

## 2024-04-05 ENCOUNTER — Inpatient Hospital Stay: Attending: Internal Medicine

## 2024-04-05 VITALS — BP 119/73 | HR 48 | Temp 97.7°F | Resp 16 | Wt 201.7 lb

## 2024-04-05 DIAGNOSIS — C714 Malignant neoplasm of occipital lobe: Secondary | ICD-10-CM

## 2024-04-05 DIAGNOSIS — Z85038 Personal history of other malignant neoplasm of large intestine: Secondary | ICD-10-CM | POA: Diagnosis not present

## 2024-04-05 DIAGNOSIS — C719 Malignant neoplasm of brain, unspecified: Secondary | ICD-10-CM

## 2024-04-05 DIAGNOSIS — Z79899 Other long term (current) drug therapy: Secondary | ICD-10-CM | POA: Insufficient documentation

## 2024-04-05 DIAGNOSIS — Z79630 Long term (current) use of alkylating agent: Secondary | ICD-10-CM | POA: Diagnosis not present

## 2024-04-05 LAB — CMP (CANCER CENTER ONLY)
ALT: 18 U/L (ref 0–44)
AST: 21 U/L (ref 15–41)
Albumin: 4.2 g/dL (ref 3.5–5.0)
Alkaline Phosphatase: 81 U/L (ref 38–126)
Anion gap: 5 (ref 5–15)
BUN: 13 mg/dL (ref 8–23)
CO2: 27 mmol/L (ref 22–32)
Calcium: 9.3 mg/dL (ref 8.9–10.3)
Chloride: 106 mmol/L (ref 98–111)
Creatinine: 0.98 mg/dL (ref 0.61–1.24)
GFR, Estimated: >60 mL/min (ref >60)
Glucose, Bld: 91 mg/dL (ref 70–99)
Potassium: 4 mmol/L (ref 3.5–5.1)
Sodium: 138 mmol/L (ref 135–145)
Total Bilirubin: 0.5 mg/dL (ref 0.0–1.2)
Total Protein: 6.8 g/dL (ref 6.5–8.1)

## 2024-04-05 LAB — CBC WITH DIFFERENTIAL (CANCER CENTER ONLY)
Abs Immature Granulocytes: 0.01 10*3/uL (ref 0.00–0.07)
Basophils Absolute: 0.1 10*3/uL (ref 0.0–0.1)
Basophils Relative: 2 %
Eosinophils Absolute: 0.2 10*3/uL (ref 0.0–0.5)
Eosinophils Relative: 4 %
HCT: 39.1 % (ref 39.0–52.0)
Hemoglobin: 12.9 g/dL — ABNORMAL LOW (ref 13.0–17.0)
Immature Granulocytes: 0 %
Lymphocytes Relative: 33 %
Lymphs Abs: 1.6 10*3/uL (ref 0.7–4.0)
MCH: 28.6 pg (ref 26.0–34.0)
MCHC: 33 g/dL (ref 30.0–36.0)
MCV: 86.7 fL (ref 80.0–100.0)
Monocytes Absolute: 0.6 10*3/uL (ref 0.1–1.0)
Monocytes Relative: 12 %
Neutro Abs: 2.3 10*3/uL (ref 1.7–7.7)
Neutrophils Relative %: 49 %
Platelet Count: 239 10*3/uL (ref 150–400)
RBC: 4.51 MIL/uL (ref 4.22–5.81)
RDW: 14.4 % (ref 11.5–15.5)
WBC Count: 4.7 10*3/uL (ref 4.0–10.5)
nRBC: 0 % (ref 0.0–0.2)

## 2024-04-05 MED ORDER — TEMOZOLOMIDE 100 MG PO CAPS
200.0000 mg | ORAL_CAPSULE | Freq: Every day | ORAL | 0 refills | Status: DC
Start: 2024-04-05 — End: 2024-05-03
  Filled 2024-04-05 (×2): qty 10, 5d supply, fill #0

## 2024-04-05 MED ORDER — ONDANSETRON HCL 8 MG PO TABS
8.0000 mg | ORAL_TABLET | Freq: Three times a day (TID) | ORAL | 1 refills | Status: DC | PRN
  Filled 2024-04-05 (×2): qty 30, 10d supply, fill #0

## 2024-04-05 MED ORDER — TEMOZOLOMIDE 250 MG PO CAPS
250.0000 mg | ORAL_CAPSULE | Freq: Every day | ORAL | 0 refills | Status: DC
Start: 2024-04-05 — End: 2024-05-03
  Filled 2024-04-05 (×2): qty 5, 5d supply, fill #0

## 2024-04-05 NOTE — Progress Notes (Signed)
 [facility] [address]  [place], [place] [address] [phone]   Interval Evaluation  Date of Service: 04/05/24 Patient Name: Trevor Contreras Patient MRN: [id number] Patient DOB: 1956/07/22 Provider: Mamie Searles, MD  Identifying Statement:  Trevor Contreras is a 68 y.o. male with right occipital glioblastoma    Oncologic History: Oncology History  Glioblastoma multiforme of occipital lobe (HCC)  11/27/2023 Surgery   Craniotomy, resection of right occipital mass with Dr. Reather Campbell; path is glioblastoma, [facility]   12/29/2023 -  Chemotherapy   Patient is on Treatment Plan : BRAIN GLIOBLASTOMA Radiation Therapy With Concurrent Temozolomide  75 mg/m2 Daily Followed By Sequential Maintenance Temozolomide  x 6-12 cycles       Biomarkers:  MGMT Unmethylated.  IDH 1/2 Wild type.  EGFR Unknown  TERT Unknown   Interval History: Trevor Contreras presents today for follow up, now having completed cycle #1 adjuvant Temodar .  He tolerated treatment well overall.  No new or progressive changes today.  Doing ok with the Vimpat  100mg  BID right now.  Denies headaches, seizures.     H+P (12/16/23) Patient presented to neurologic attention on 11/09/23 with new onset seizures, characterized as "flashing lights in the left peripheral vision". CNS imaging demonstrated enhancing mass in the right occipital lobe, c/w primary tumor.  He underwent craniotomy, resection with Dr. Reather Campbell at [facility] on 11/27/23.  Following surgery, he has had few issues, mostly normal vision.  Is independent with activities of daily living, no further seizures.  Medications: Current Outpatient Medications on File Prior to Visit  Medication Sig Dispense Refill   Lacosamide  (VIMPAT ) 100 MG TABS Take 1 tablet (100 mg total) by mouth in the morning and at bedtime. 60 tablet 2   ondansetron  (ZOFRAN ) 8 MG tablet Take 1 tablet (8 mg total) by mouth every 8 (eight) hours as needed for nausea or vomiting.  May take 30-60 minutes prior to Temodar  administration if nausea/vomiting occurs as needed. 30 tablet 1   No current facility-administered medications on file prior to visit.    Allergies: No Known Allergies Past Medical History:  Past Medical History:  Diagnosis Date   Colon cancer (HCC) 2002   Past Surgical History:  Past Surgical History:  Procedure Laterality Date   COLON SURGERY     COLONOSCOPY     EYE SURGERY     trying to correct blurred vision in left eye; unsuccessful   KNEE ARTHROSCOPY     bilat; several surgeries   SHOULDER ARTHROSCOPY     rotator cuff, reattach part of long bicep, Dr Audery Lean   Social History:  Social History   Socioeconomic History   Marital status: Married    Spouse name: Not on file   Number of children: Not on file   Years of education: Not on file   Highest education level: Not on file  Occupational History   Not on file  Tobacco Use   Smoking status: Never   Smokeless tobacco: Never  Vaping Use   Vaping status: Never Used  Substance and Sexual Activity   Alcohol use: Yes    Comment: 2-3 times weekly, beer   Drug use: No   Sexual activity: Not on file  Other Topics Concern   Not on file  Social History Narrative   Not on file   Social Drivers of Health   Financial Resource Strain: Low Risk  (11/22/2023)   Received from [facility]   Overall Financial Resource Strain (CARDIA)  Difficulty of Paying Living Expenses: Not hard at all  Food Insecurity: No Food Insecurity (11/22/2023)   Received from [facility]   Hunger Vital Sign    Worried About Running Out of Food in the Last Year: Never true    Ran Out of Food in the Last Year: Never true  Transportation Needs: No Transportation Needs (11/22/2023)   Received from [facility] - Transportation    In the past 12 months, has lack of transportation kept you from medical appointments or from getting medications?:  No    Lack of Transportation (Non-Medical): No  Physical Activity: Sufficiently Active (11/17/2023)   Received from [facility]   Exercise Vital Sign    Days of Exercise per Week: 5 days    Minutes of Exercise per Session: 60 min  Stress: Stress Concern Present (11/17/2023)   Received from [facility] of Occupational Health - Occupational Stress Questionnaire    Feeling of Stress : Very much  Social Connections: Socially Integrated (11/17/2023)   Received from [facility]   Social Network    How would you rate your social network (family, work, friends)?: Good participation with social networks  Intimate Partner Violence: Not At Risk (11/17/2023)   Received from [facility]   HITS    Over the last 12 months how often did your partner physically hurt you?: Never    Over the last 12 months how often did your partner insult you or talk down to you?: Never    Over the last 12 months how often did your partner threaten you with physical harm?: Never    Over the last 12 months how often did your partner scream or curse at you?: Never   Family History:  Family History  Problem Relation Age of Onset   Cancer Mother        breast   Cancer Father        lung   Colon cancer Neg Hx    Colon polyps Neg Hx    Esophageal cancer Neg Hx    Stomach cancer Neg Hx    Rectal cancer Neg Hx     Review of Systems: Constitutional: Doesn't report fevers, chills or abnormal weight loss Eyes: Doesn't report blurriness of vision Ears, nose, mouth, throat, and face: Doesn't report sore throat Respiratory: Doesn't report cough, dyspnea or wheezes Cardiovascular: Doesn't report palpitation, chest discomfort  Gastrointestinal:  Doesn't report nausea, constipation, diarrhea GU: Doesn't report incontinence Skin: Doesn't report skin rashes Neurological: Per HPI Musculoskeletal: Doesn't report joint pain Behavioral/Psych: Doesn't report anxiety  Physical Exam: Vitals:    04/05/24 1005  BP: 119/73  Pulse: (!) 48  Resp: 16  Temp: 97.7 F (36.5 C)  SpO2: 100%   KPS: 80. General: Alert, cooperative, pleasant, in no acute distress Head: Normal EENT: No conjunctival injection or scleral icterus.  Lungs: Resp effort normal Cardiac: Regular rate Abdomen: Non-distended abdomen Skin: No rashes cyanosis or petechiae. Extremities: No clubbing or edema  Neurologic Exam: Mental Status: Awake, alert, attentive to examiner. Oriented to self and environment. Language is fluent with intact comprehension.  Cranial Nerves: Visual acuity is grossly normal. Visual fields are full. Extra-ocular movements intact. No ptosis. Face is symmetric Motor: Tone and bulk are normal. Power is full in both arms and legs. Reflexes are symmetric, no pathologic reflexes present.  Sensory: Intact to light touch Gait: Normal.   Labs: I have reviewed the data as listed  Component Value Date/Time   NA 140 03/01/2024 0928   NA 140 10/18/2022 0949   K 4.4 03/01/2024 0928   CL 107 03/01/2024 0928   CO2 28 03/01/2024 0928   GLUCOSE 98 03/01/2024 0928   BUN 11 03/01/2024 0928   BUN 21 10/18/2022 0949   CREATININE 0.89 03/01/2024 0928   CREATININE 0.91 05/31/2013 1310   CALCIUM  9.3 03/01/2024 0928   PROT 6.7 03/01/2024 0928   PROT 6.3 10/18/2022 0949   ALBUMIN 4.2 03/01/2024 0928   ALBUMIN 4.0 10/18/2022 0949   AST 21 03/01/2024 0928   ALT 21 03/01/2024 0928   ALKPHOS 75 03/01/2024 0928   BILITOT 0.6 03/01/2024 0928   GFRNONAA >60 03/01/2024 0928   GFRNONAA >89 05/31/2013 1310   GFRAA 88 06/02/2020 1444   GFRAA >89 05/31/2013 1310   Lab Results  Component Value Date   WBC 4.7 04/05/2024   NEUTROABS 2.3 04/05/2024   HGB 12.9 (L) 04/05/2024   HCT 39.1 04/05/2024   MCV 86.7 04/05/2024   PLT 239 04/05/2024     Assessment/Plan Glioblastoma multiforme of occipital lobe (HCC)  Ruan Atondo is clinically stable today, now having completed cycle #1 5-day Temodar .  Labs  are within normal limits.  We recommended initiating treatment with cycle #2 dose increased to Temozolomide  200 mg/m2, on for five days and off for twenty three days in twenty eight day cycles. The patient will have a complete blood count performed on days 21 and 28 of each cycle, and a comprehensive metabolic panel performed on day 28 of each cycle. Labs may need to be performed more often. Zofran  will prescribed for home use for nausea/vomiting.   Informed consent was obtained verbally at bedside to proceed with oral chemotherapy.  Chemotherapy should be held for the following:  ANC less than 1,000  Platelets less than 100,000  LFT or creatinine greater than 2x ULN  If clinical concerns/contraindications develop  Ok to continue Vimpat  100mg  BID.  We ask that Marvens Beas return to clinic in 1 month with MRI brain prior to cycle #3, or sooner as needed.  All questions were answered. The patient knows to call the clinic with any problems, questions or concerns. No barriers to learning were detected.  The total time spent in the encounter was 30 minutes and more than 50% was on counseling and review of test results   Mamie Searles, MD Medical Director of Neuro-Oncology [facility] 04/05/24 10:11 AM

## 2024-04-05 NOTE — Progress Notes (Signed)
 Specialty Pharmacy Refill Coordination Note  Trevor Contreras is a 68 y.o. male contacted today regarding refills of specialty medication(s) Temozolomide  (TEMODAR )   Patient requested Delivery   Delivery date: 04/06/24   Verified address: [address]   [place] [address]   Medication will be filled on 04/05/24.

## 2024-04-05 NOTE — Progress Notes (Signed)
 Specialty Pharmacy Ongoing Clinical Assessment Note  Trevor Contreras is a 68 y.o. male who is being followed by the specialty pharmacy service for RxSp Oncology   Patient's specialty medication(s) reviewed today: Temozolomide  (TEMODAR )   Missed doses in the last 4 weeks: 0   Patient/Caregiver did not have any additional questions or concerns.   No data recorded  Adverse events/side effects summary: No adverse events/side effects   Patient's therapy is appropriate to: Continue    Goals Addressed             This Visit's Progress    Slow Disease Progression   On track    Patient is unable to be assessed as therapy was recently initiated. Patient will maintain adherence.  Dr. Mark Sil reports patient is clinically stable at 04/05/24 office visit.          Follow up:  3 months  Malachi Screws Specialty Pharmacist

## 2024-04-06 ENCOUNTER — Encounter: Payer: Self-pay | Admitting: Internal Medicine

## 2024-04-07 ENCOUNTER — Other Ambulatory Visit: Payer: Self-pay

## 2024-04-07 ENCOUNTER — Telehealth: Payer: Self-pay | Admitting: Internal Medicine

## 2024-04-07 NOTE — Progress Notes (Signed)
 Patient's wife called to check the delivery status of temodar . Reached out to [facility] and they estimate delivery will be around 5pm. Advised patient's wife to please call us  ASAP if medication is not received by 5 so pharmacy can contact CE for update.

## 2024-04-07 NOTE — Telephone Encounter (Signed)
 Patient scheduled appointments. Patient is aware of all appointment details.

## 2024-04-08 ENCOUNTER — Other Ambulatory Visit: Payer: Self-pay

## 2024-04-08 ENCOUNTER — Other Ambulatory Visit (HOSPITAL_COMMUNITY): Payer: Self-pay

## 2024-04-08 NOTE — Progress Notes (Signed)
 Clinical Intervention Note  Clinical Intervention Notes: Patient wife called provider office to advise of possible error on label. Temozolomide  100mg  was inadvertently dispensed with label stating total of 340mg  as was his previous dose but updated total should have been 450mg . Patient was dispensed as 100mg  capsules to take 2 daily and 250mg  capsules to take 1 daily for total of 450mg  and took his first dose last night appropriately as prescribed. Spoke to patient wife to counsel that he should continue on this correct dose for the remainder of his cycle and to disregard incorrect total that was printed on label. Wife additionally inquired about the conversation she recalled in clinic where she believed total intended dose to be 440mg . Spoke with Ivette Marks who confirmed with Dr. Mark Sil that 450mg  is correct as dispensed. Labeling error has been logged appropriately and Adelene Adolf has been informed.   Clinical Intervention Outcomes: Improved therapy adherence   Rena Carnes Specialty Pharmacist

## 2024-04-15 ENCOUNTER — Encounter: Payer: Self-pay | Admitting: Internal Medicine

## 2024-04-21 ENCOUNTER — Other Ambulatory Visit (HOSPITAL_COMMUNITY): Payer: Self-pay

## 2024-04-28 ENCOUNTER — Other Ambulatory Visit: Payer: Self-pay

## 2024-04-29 ENCOUNTER — Inpatient Hospital Stay
Admission: RE | Admit: 2024-04-29 | Discharge: 2024-04-29 | Disposition: A | Discharge status: Home / Self Care | Payer: Self-pay | Source: Ambulatory Visit | Attending: Internal Medicine | Admitting: Internal Medicine

## 2024-04-29 ENCOUNTER — Encounter: Payer: Self-pay | Admitting: *Deleted

## 2024-04-29 ENCOUNTER — Other Ambulatory Visit: Payer: Self-pay | Admitting: *Deleted

## 2024-04-29 DIAGNOSIS — Z85820 Personal history of malignant melanoma of skin: Secondary | ICD-10-CM | POA: Diagnosis not present

## 2024-04-29 DIAGNOSIS — C719 Malignant neoplasm of brain, unspecified: Secondary | ICD-10-CM | POA: Diagnosis not present

## 2024-04-29 DIAGNOSIS — D496 Neoplasm of unspecified behavior of brain: Secondary | ICD-10-CM | POA: Diagnosis not present

## 2024-04-29 DIAGNOSIS — Z85038 Personal history of other malignant neoplasm of large intestine: Secondary | ICD-10-CM | POA: Diagnosis not present

## 2024-04-29 NOTE — Progress Notes (Signed)
 Requested [facility] send images from MRI brain done on 04/29/24 to [facility].  Email to [employer].

## 2024-05-03 ENCOUNTER — Other Ambulatory Visit: Payer: Self-pay | Admitting: Pharmacy Technician

## 2024-05-03 ENCOUNTER — Inpatient Hospital Stay: Attending: Internal Medicine

## 2024-05-03 ENCOUNTER — Other Ambulatory Visit: Payer: Self-pay

## 2024-05-03 ENCOUNTER — Inpatient Hospital Stay (HOSPITAL_BASED_OUTPATIENT_CLINIC_OR_DEPARTMENT_OTHER): Admitting: Internal Medicine

## 2024-05-03 ENCOUNTER — Telehealth: Payer: Self-pay | Admitting: Pharmacist

## 2024-05-03 VITALS — BP 124/74 | HR 57 | Temp 97.7°F | Resp 18 | Wt 200.9 lb

## 2024-05-03 DIAGNOSIS — C714 Malignant neoplasm of occipital lobe: Secondary | ICD-10-CM

## 2024-05-03 DIAGNOSIS — Z79899 Other long term (current) drug therapy: Secondary | ICD-10-CM | POA: Diagnosis not present

## 2024-05-03 DIAGNOSIS — C719 Malignant neoplasm of brain, unspecified: Secondary | ICD-10-CM

## 2024-05-03 DIAGNOSIS — Z923 Personal history of irradiation: Secondary | ICD-10-CM | POA: Diagnosis not present

## 2024-05-03 DIAGNOSIS — Z9221 Personal history of antineoplastic chemotherapy: Secondary | ICD-10-CM | POA: Diagnosis not present

## 2024-05-03 DIAGNOSIS — Z79630 Long term (current) use of alkylating agent: Secondary | ICD-10-CM | POA: Insufficient documentation

## 2024-05-03 LAB — CMP (CANCER CENTER ONLY)
ALT: 20 U/L (ref 0–44)
AST: 21 U/L (ref 15–41)
Albumin: 4.2 g/dL (ref 3.5–5.0)
Alkaline Phosphatase: 77 U/L (ref 38–126)
Anion gap: 5 (ref 5–15)
BUN: 17 mg/dL (ref 8–23)
CO2: 28 mmol/L (ref 22–32)
Calcium: 9.4 mg/dL (ref 8.9–10.3)
Chloride: 107 mmol/L (ref 98–111)
Creatinine: 0.93 mg/dL (ref 0.61–1.24)
GFR, Estimated: >60 mL/min (ref >60)
Glucose, Bld: 75 mg/dL (ref 70–99)
Potassium: 4.2 mmol/L (ref 3.5–5.1)
Sodium: 140 mmol/L (ref 135–145)
Total Bilirubin: 0.5 mg/dL (ref 0.0–1.2)
Total Protein: 6.8 g/dL (ref 6.5–8.1)

## 2024-05-03 LAB — CBC WITH DIFFERENTIAL (CANCER CENTER ONLY)
Abs Immature Granulocytes: 0.01 10*3/uL (ref 0.00–0.07)
Basophils Absolute: 0.1 10*3/uL (ref 0.0–0.1)
Basophils Relative: 2 %
Eosinophils Absolute: 0.2 10*3/uL (ref 0.0–0.5)
Eosinophils Relative: 3 %
HCT: 40 % (ref 39.0–52.0)
Hemoglobin: 13.3 g/dL (ref 13.0–17.0)
Immature Granulocytes: 0 %
Lymphocytes Relative: 30 %
Lymphs Abs: 1.5 10*3/uL (ref 0.7–4.0)
MCH: 28.7 pg (ref 26.0–34.0)
MCHC: 33.3 g/dL (ref 30.0–36.0)
MCV: 86.2 fL (ref 80.0–100.0)
Monocytes Absolute: 0.7 10*3/uL (ref 0.1–1.0)
Monocytes Relative: 13 %
Neutro Abs: 2.5 10*3/uL (ref 1.7–7.7)
Neutrophils Relative %: 52 %
Platelet Count: 222 10*3/uL (ref 150–400)
RBC: 4.64 MIL/uL (ref 4.22–5.81)
RDW: 14.5 % (ref 11.5–15.5)
WBC Count: 4.9 10*3/uL (ref 4.0–10.5)
nRBC: 0 % (ref 0.0–0.2)

## 2024-05-03 MED ORDER — LIDOCAINE-PRILOCAINE 2.5-2.5 % EX CREA: 1.0000 | TOPICAL_CREAM | CUTANEOUS | 0 refills | Status: DC | PRN

## 2024-05-03 MED ORDER — TEMOZOLOMIDE 250 MG PO CAPS
250.0000 mg | ORAL_CAPSULE | Freq: Every day | ORAL | 0 refills | Status: DC
  Filled 2024-05-03: qty 5, 28d supply, fill #0

## 2024-05-03 MED ORDER — TEMOZOLOMIDE 100 MG PO CAPS
200.0000 mg | ORAL_CAPSULE | Freq: Every day | ORAL | 0 refills | Status: DC
Start: 2024-05-03 — End: 2024-05-27
  Filled 2024-05-03: qty 10, 28d supply, fill #0

## 2024-05-03 MED ORDER — TEMOZOLOMIDE 100 MG PO CAPS
200.0000 mg | ORAL_CAPSULE | Freq: Every day | ORAL | 0 refills | Status: DC
Start: 2024-05-03 — End: 2024-05-03

## 2024-05-03 MED ORDER — TEMOZOLOMIDE 250 MG PO CAPS: 250.0000 mg | ORAL_CAPSULE | Freq: Every day | ORAL | 0 refills | Status: DC

## 2024-05-03 MED ORDER — TEMOZOLOMIDE 100 MG PO CAPS
200.0000 mg | ORAL_CAPSULE | Freq: Every day | ORAL | 0 refills | Status: DC
  Filled 2024-05-03: qty 10, 28d supply, fill #0

## 2024-05-03 MED ORDER — LACOSAMIDE 100 MG PO TABS: 100.0000 mg | ORAL_TABLET | Freq: Two times a day (BID) | ORAL | 2 refills | Status: DC

## 2024-05-03 NOTE — Progress Notes (Signed)
 Specialty Pharmacy Refill Coordination Note  Trevor Contreras is a 68 y.o. male contacted today regarding refills of specialty medication(s) Temozolomide  (TEMODAR )  Spoke with wife & patient both on the line  Patient requested Delivery   Delivery date: 05/04/24   Verified address: [address] [place]   Medication will be filled on 05/03/24.

## 2024-05-03 NOTE — Telephone Encounter (Signed)
 Oral Oncology Pharmacist Encounter  Temozolomide  prescription instructions updated to include days on/off per insurance requirements. Prescriptions resent back to [facility] for dispensing.  Jude Norton, PharmD, BCPS, BCOP Hematology/Oncology Clinical Pharmacist [facility] and [facility] Oral Chemotherapy Navigation Clinics [phone] 05/03/2024 11:51 AM

## 2024-05-03 NOTE — Progress Notes (Signed)
 [facility] [address]  [place], [place] [address] [phone]   Interval Evaluation  Date of Service: 05/03/24 Patient Name: Trevor Contreras Patient MRN: [id number] Patient DOB: 1956-10-10 Provider: Mamie Searles, MD  Identifying Statement:  Trevor Contreras is a 68 y.o. male with right occipital glioblastoma    Oncologic History: Oncology History  Glioblastoma multiforme of occipital lobe (HCC)  11/27/2023 Surgery   Craniotomy, resection of right occipital mass with Dr. Reather Campbell; path is glioblastoma, [facility]   12/29/2023 -  Chemotherapy   Patient is on Treatment Plan : BRAIN GLIOBLASTOMA Radiation Therapy With Concurrent Temozolomide  75 mg/m2 Daily Followed By Sequential Maintenance Temozolomide  x 6-12 cycles       Biomarkers:  MGMT Unmethylated.  IDH 1/2 Wild type.  EGFR Unknown  TERT Unknown   Interval History: Trevor Contreras presents today for follow up, now having completed cycle #2 adjuvant Temodar , dose increased.  Had visit at [facility] this past week.  He tolerated treatment well overall.  No new or progressive changes today.  Doing ok with the Vimpat  100mg  BID right now.  Denies headaches, seizures.     H+P (12/16/23) Patient presented to neurologic attention on 11/09/23 with new onset seizures, characterized as "flashing lights in the left peripheral vision". CNS imaging demonstrated enhancing mass in the right occipital lobe, c/w primary tumor.  He underwent craniotomy, resection with Dr. Reather Campbell at [facility] on 11/27/23.  Following surgery, he has had few issues, mostly normal vision.  Is independent with activities of daily living, no further seizures.  Medications: Current Outpatient Medications on File Prior to Visit  Medication Sig Dispense Refill   Lacosamide  (VIMPAT ) 100 MG TABS Take 1 tablet (100 mg total) by mouth in the morning and at bedtime. 60 tablet 2   ondansetron  (ZOFRAN ) 8 MG tablet Take 1 tablet (8 mg total) by mouth  every 8 (eight) hours as needed for nausea or vomiting. May take 30-60 minutes prior to Temodar  administration if nausea/vomiting occurs as needed. 30 tablet 1   ondansetron  (ZOFRAN ) 8 MG tablet Take 1 tablet (8 mg total) by mouth every 8 (eight) hours as needed for nausea or vomiting. May take 30-60 minutes prior to Temodar  administration if nausea/vomiting occurs as needed. 30 tablet 1   temozolomide  (TEMODAR ) 100 MG capsule Take 2 capsules (200 mg total) by mouth daily. (Take with one 140 mg capsule for total daily dose of 340 mg). Take for 5 days on, 23 days off. Repeat every 28 days. May take on an empty stomach to decrease nausea & vomiting. 10 capsule 0   temozolomide  (TEMODAR ) 250 MG capsule Take 1 capsule (250 mg total) by mouth daily. 5 capsule 0   No current facility-administered medications on file prior to visit.    Allergies: No Known Allergies Past Medical History:  Past Medical History:  Diagnosis Date   Colon cancer (HCC) 2002   Past Surgical History:  Past Surgical History:  Procedure Laterality Date   COLON SURGERY     COLONOSCOPY     EYE SURGERY     trying to correct blurred vision in left eye; unsuccessful   KNEE ARTHROSCOPY     bilat; several surgeries   SHOULDER ARTHROSCOPY     rotator cuff, reattach part of long bicep, Dr Audery Lean   Social History:  Social History   Socioeconomic History   Marital status: Married    Spouse name: Not on file   Number of children: Not on  file   Years of education: Not on file   Highest education level: Not on file  Occupational History   Not on file  Tobacco Use   Smoking status: Never   Smokeless tobacco: Never  Vaping Use   Vaping status: Never Used  Substance and Sexual Activity   Alcohol use: Yes    Comment: 2-3 times weekly, beer   Drug use: No   Sexual activity: Not on file  Other Topics Concern   Not on file  Social History Narrative   Not on file   Social Drivers of Health   Financial Resource Strain:  Low Risk  (11/22/2023)   Received from [facility]   Overall Financial Resource Strain (CARDIA)    Difficulty of Paying Living Expenses: Not hard at all  Food Insecurity: No Food Insecurity (11/22/2023)   Received from [facility]   Hunger Vital Sign    Worried About Running Out of Food in the Last Year: Never true    Ran Out of Food in the Last Year: Never true  Transportation Needs: No Transportation Needs (11/22/2023)   Received from [facility] - Transportation    In the past 12 months, has lack of transportation kept you from medical appointments or from getting medications?: No    Lack of Transportation (Non-Medical): No  Physical Activity: Sufficiently Active (11/17/2023)   Received from [facility]   Exercise Vital Sign    Days of Exercise per Week: 5 days    Minutes of Exercise per Session: 60 min  Stress: Stress Concern Present (11/17/2023)   Received from [facility] - Occupational Stress Questionnaire    Feeling of Stress : Very much  Social Connections: Socially Integrated (11/17/2023)   Received from [facility]   Social Network    How would you rate your social network (family, work, friends)?: Good participation with social networks  Intimate Partner Violence: Not At Risk (11/17/2023)   Received from [facility]   HITS    Over the last 12 months how often did your partner physically hurt you?: Never    Over the last 12 months how often did your partner insult you or talk down to you?: Never    Over the last 12 months how often did your partner threaten you with physical harm?: Never    Over the last 12 months how often did your partner scream or curse at you?: Never   Family History:  Family History  Problem Relation Age of Onset   Cancer Mother        breast   Cancer Father        lung   Colon cancer Neg Hx    Colon polyps Neg Hx    Esophageal  cancer Neg Hx    Stomach cancer Neg Hx    Rectal cancer Neg Hx     Review of Systems: Constitutional: Doesn't report fevers, chills or abnormal weight loss Eyes: Doesn't report blurriness of vision Ears, nose, mouth, throat, and face: Doesn't report sore throat Respiratory: Doesn't report cough, dyspnea or wheezes Cardiovascular: Doesn't report palpitation, chest discomfort  Gastrointestinal:  Doesn't report nausea, constipation, diarrhea GU: Doesn't report incontinence Skin: Doesn't report skin rashes Neurological: Per HPI Musculoskeletal: Doesn't report joint pain Behavioral/Psych: Doesn't report anxiety  Physical Exam: Vitals:   05/03/24 1016  BP: 124/74  Pulse: (!) 57  Resp: 18  Temp: 97.7 F (  36.5 C)  SpO2: 99%   KPS: 80. General: Alert, cooperative, pleasant, in no acute distress Head: Normal EENT: No conjunctival injection or scleral icterus.  Lungs: Resp effort normal Cardiac: Regular rate Abdomen: Non-distended abdomen Skin: No rashes cyanosis or petechiae. Extremities: No clubbing or edema  Neurologic Exam: Mental Status: Awake, alert, attentive to examiner. Oriented to self and environment. Language is fluent with intact comprehension.  Cranial Nerves: Visual acuity is grossly normal. Visual fields are full. Extra-ocular movements intact. No ptosis. Face is symmetric Motor: Tone and bulk are normal. Power is full in both arms and legs. Reflexes are symmetric, no pathologic reflexes present.  Sensory: Intact to light touch Gait: Normal.   Labs: I have reviewed the data as listed    Component Value Date/Time   NA 138 04/05/2024 0945   NA 140 10/18/2022 0949   K 4.0 04/05/2024 0945   CL 106 04/05/2024 0945   CO2 27 04/05/2024 0945   GLUCOSE 91 04/05/2024 0945   BUN 13 04/05/2024 0945   BUN 21 10/18/2022 0949   CREATININE 0.98 04/05/2024 0945   CREATININE 0.91 05/31/2013 1310   CALCIUM  9.3 04/05/2024 0945   PROT 6.8 04/05/2024 0945   PROT 6.3  10/18/2022 0949   ALBUMIN 4.2 04/05/2024 0945   ALBUMIN 4.0 10/18/2022 0949   AST 21 04/05/2024 0945   ALT 18 04/05/2024 0945   ALKPHOS 81 04/05/2024 0945   BILITOT 0.5 04/05/2024 0945   GFRNONAA >60 04/05/2024 0945   GFRNONAA >89 05/31/2013 1310   GFRAA 88 06/02/2020 1444   GFRAA >89 05/31/2013 1310   Lab Results  Component Value Date   WBC 4.9 05/03/2024   NEUTROABS 2.5 05/03/2024   HGB 13.3 05/03/2024   HCT 40.0 05/03/2024   MCV 86.2 05/03/2024   PLT 222 05/03/2024    Imaging:  [facility] Clinician Interpretation: I have personally reviewed the CNS images as listed.  My interpretation, in the context of the patient's clinical presentation, is stable disease external scan  No results found.  Assessment/Plan Glioblastoma multiforme of occipital lobe (HCC)  Trevor Contreras is clinically stable today, now having completed cycle #2 5-day Temodar .  MRI brain demonstrates stable findings.  We recommended initiating treatment with cycle #3 Temozolomide  200 mg/m2, on for five days and off for twenty three days in twenty eight day cycles. The patient will have a complete blood count performed on days 21 and 28 of each cycle, and a comprehensive metabolic panel performed on day 28 of each cycle. Labs may need to be performed more often. Zofran  will prescribed for home use for nausea/vomiting.   Informed consent was obtained verbally at bedside to proceed with oral chemotherapy.  Chemotherapy should be held for the following:  ANC less than 1,000  Platelets less than 100,000  LFT or creatinine greater than 2x ULN  If clinical concerns/contraindications develop  Ok to continue Vimpat  100mg  BID.  We ask that Trevor Contreras return to clinic in 1 month with labs prior to cycle #4, or sooner as needed.  All questions were answered. The patient knows to call the clinic with any problems, questions or concerns. No barriers to learning were detected.  The total time spent in the encounter was  40 minutes and more than 50% was on counseling and review of test results   Mamie Searles, MD Medical Director of Neuro-Oncology [facility] [place] 05/03/24 10:29 AM

## 2024-05-05 ENCOUNTER — Other Ambulatory Visit: Payer: Self-pay

## 2024-05-20 ENCOUNTER — Encounter: Payer: Self-pay | Admitting: Internal Medicine

## 2024-05-24 ENCOUNTER — Other Ambulatory Visit: Payer: Self-pay

## 2024-05-27 ENCOUNTER — Other Ambulatory Visit: Payer: Self-pay

## 2024-05-27 ENCOUNTER — Other Ambulatory Visit (HOSPITAL_COMMUNITY): Payer: Self-pay

## 2024-05-27 ENCOUNTER — Inpatient Hospital Stay: Admitting: Internal Medicine

## 2024-05-27 ENCOUNTER — Inpatient Hospital Stay

## 2024-05-27 ENCOUNTER — Telehealth: Payer: Self-pay | Admitting: Pharmacist

## 2024-05-27 VITALS — BP 110/72 | HR 53 | Temp 97.9°F | Resp 20 | Wt 201.7 lb

## 2024-05-27 DIAGNOSIS — C719 Malignant neoplasm of brain, unspecified: Secondary | ICD-10-CM

## 2024-05-27 DIAGNOSIS — Z9221 Personal history of antineoplastic chemotherapy: Secondary | ICD-10-CM | POA: Diagnosis not present

## 2024-05-27 DIAGNOSIS — Z923 Personal history of irradiation: Secondary | ICD-10-CM | POA: Diagnosis not present

## 2024-05-27 DIAGNOSIS — C714 Malignant neoplasm of occipital lobe: Secondary | ICD-10-CM | POA: Diagnosis not present

## 2024-05-27 DIAGNOSIS — Z79899 Other long term (current) drug therapy: Secondary | ICD-10-CM | POA: Diagnosis not present

## 2024-05-27 DIAGNOSIS — Z79630 Long term (current) use of alkylating agent: Secondary | ICD-10-CM | POA: Diagnosis not present

## 2024-05-27 LAB — CBC WITH DIFFERENTIAL (CANCER CENTER ONLY)
Abs Immature Granulocytes: 0.01 10*3/uL (ref 0.00–0.07)
Basophils Absolute: 0.1 10*3/uL (ref 0.0–0.1)
Basophils Relative: 2 %
Eosinophils Absolute: 0.1 10*3/uL (ref 0.0–0.5)
Eosinophils Relative: 3 %
HCT: 39.9 % (ref 39.0–52.0)
Hemoglobin: 13.4 g/dL (ref 13.0–17.0)
Immature Granulocytes: 0 %
Lymphocytes Relative: 27 %
Lymphs Abs: 1.2 10*3/uL (ref 0.7–4.0)
MCH: 28.9 pg (ref 26.0–34.0)
MCHC: 33.6 g/dL (ref 30.0–36.0)
MCV: 86 fL (ref 80.0–100.0)
Monocytes Absolute: 0.5 10*3/uL (ref 0.1–1.0)
Monocytes Relative: 12 %
Neutro Abs: 2.5 10*3/uL (ref 1.7–7.7)
Neutrophils Relative %: 56 %
Platelet Count: 241 10*3/uL (ref 150–400)
RBC: 4.64 MIL/uL (ref 4.22–5.81)
RDW: 14.9 % (ref 11.5–15.5)
WBC Count: 4.5 10*3/uL (ref 4.0–10.5)
nRBC: 0 % (ref 0.0–0.2)

## 2024-05-27 LAB — CMP (CANCER CENTER ONLY)
ALT: 17 U/L (ref 0–44)
AST: 19 U/L (ref 15–41)
Albumin: 4.2 g/dL (ref 3.5–5.0)
Alkaline Phosphatase: 72 U/L (ref 38–126)
Anion gap: 7 (ref 5–15)
BUN: 35 mg/dL — ABNORMAL HIGH (ref 8–23)
CO2: 24 mmol/L (ref 22–32)
Calcium: 9.2 mg/dL (ref 8.9–10.3)
Chloride: 106 mmol/L (ref 98–111)
Creatinine: 1.02 mg/dL (ref 0.61–1.24)
GFR, Estimated: >60 mL/min (ref >60)
Glucose, Bld: 97 mg/dL (ref 70–99)
Potassium: 4.2 mmol/L (ref 3.5–5.1)
Sodium: 137 mmol/L (ref 135–145)
Total Bilirubin: 0.7 mg/dL (ref 0.0–1.2)
Total Protein: 6.8 g/dL (ref 6.5–8.1)

## 2024-05-27 MED ORDER — TEMOZOLOMIDE 250 MG PO CAPS
250.0000 mg | ORAL_CAPSULE | Freq: Every day | ORAL | 0 refills | Status: DC
  Filled 2024-05-27: qty 5, 5d supply, fill #0

## 2024-05-27 MED ORDER — TEMOZOLOMIDE 250 MG PO CAPS
250.0000 mg | ORAL_CAPSULE | Freq: Every day | ORAL | 0 refills | Status: DC
  Filled 2024-05-27 (×2): qty 5, 5d supply, fill #0

## 2024-05-27 MED ORDER — LACOSAMIDE 50 MG PO TABS
50.0000 mg | ORAL_TABLET | Freq: Two times a day (BID) | ORAL | 2 refills | Status: DC
  Filled 2024-05-27 – 2024-06-01 (×2): qty 60, 30d supply, fill #0

## 2024-05-27 MED ORDER — TEMOZOLOMIDE 100 MG PO CAPS
200.0000 mg | ORAL_CAPSULE | Freq: Every day | ORAL | 0 refills | Status: DC
  Filled 2024-05-27: qty 10, 5d supply, fill #0

## 2024-05-27 MED ORDER — TEMOZOLOMIDE 100 MG PO CAPS
200.0000 mg | ORAL_CAPSULE | Freq: Every day | ORAL | 0 refills | Status: DC
  Filled 2024-05-27 (×2): qty 10, 5d supply, fill #0

## 2024-05-27 NOTE — Progress Notes (Signed)
 Specialty Pharmacy Refill Coordination Note  Trevor Contreras is a 68 y.o. male contacted today regarding refills of specialty medication(s) Temozolomide  (TEMODAR )   Patient requested Delivery   Delivery date: 05/28/24   Verified address: [address] [place]   Medication will be filled on 05/27/24.

## 2024-05-27 NOTE — Progress Notes (Signed)
 [facility] [address]  [place], [place] [address] [phone]   Interval Evaluation  Date of Service: 05/27/24 Patient Name: Trevor Contreras Patient MRN: [id number] Patient DOB: 1955/12/10 Provider: Arthea MARLA Manns, MD  Identifying Statement:  Trevor Contreras is a 68 y.o. male with right occipital glioblastoma    Oncologic History: Oncology History  Glioblastoma multiforme of occipital lobe (HCC)  11/27/2023 Surgery   Craniotomy, resection of right occipital mass with Dr. Saturnino; path is glioblastoma, IDHwt   12/29/2023 - 02/06/2024 Radiation Therapy   IMRT with concurrent Temodar  75mg /m2 Valene)   03/08/2024 -  Chemotherapy   Initiates adjuvant 5-day Temodar  150-200mg /m2     Biomarkers:  MGMT Unmethylated.  IDH 1/2 Wild type.  EGFR Unknown  TERT Unknown   Interval History: Trevor Contreras presents today for follow up, now having completed cycle #3 adjuvant Temodar , dose on 05/04/24.  He continues to tolerate treatment well overall.  No new or progressive changes today.  Doing ok with the Vimpat  100mg  BID right now, but does have some fatigue and lethargy with the drug.  Denies headaches, seizures.     H+P (12/16/23) Patient presented to neurologic attention on 11/09/23 with new onset seizures, characterized as flashing lights in the left peripheral vision. CNS imaging demonstrated enhancing mass in the right occipital lobe, c/w primary tumor.  He underwent craniotomy, resection with Dr. Saturnino [facility] on 11/27/23.  Following surgery, he has had few issues, mostly normal vision.  Is independent with activities of daily living, no further seizures.  Medications: Current Outpatient Medications on File Prior to Visit  Medication Sig Dispense Refill   Lacosamide  (VIMPAT ) 100 MG TABS Take 1 tablet (100 mg total) by mouth in the morning and at bedtime. 60 tablet 2   lidocaine -prilocaine  (EMLA ) cream Apply 1 Application topically as needed. 30 g 0    ondansetron  (ZOFRAN ) 8 MG tablet Take 1 tablet (8 mg total) by mouth every 8 (eight) hours as needed for nausea or vomiting. May take 30-60 minutes prior to Temodar  administration if nausea/vomiting occurs as needed. 30 tablet 1   ondansetron  (ZOFRAN ) 8 MG tablet Take 1 tablet (8 mg total) by mouth every 8 (eight) hours as needed for nausea or vomiting. May take 30-60 minutes prior to Temodar  administration if nausea/vomiting occurs as needed. 30 tablet 1   temozolomide  (TEMODAR ) 100 MG capsule Take 2 capsules (200 mg total) by mouth at bedtime. (Take with one 250 mg capsule for total daily dose of 450 mg). Take for 5 days on, 23 days off. Repeat every 28 days. May take on an empty stomach to decrease nausea & vomiting. 10 capsule 0   temozolomide  (TEMODAR ) 250 MG capsule Take 1 capsule (250 mg total) by mouth at bedtime. (Take with TWO 100 mg capsules for total daily dose of 450 mg). Take for 5 days on, 23 days off. Repeat every 28 days. May take on an empty stomach to decrease nausea & vomiting. 5 capsule 0   No current facility-administered medications on file prior to visit.    Allergies: No Known Allergies Past Medical History:  Past Medical History:  Diagnosis Date   Colon cancer (HCC) 2002   Past Surgical History:  Past Surgical History:  Procedure Laterality Date   COLON SURGERY     COLONOSCOPY     EYE SURGERY     trying to correct blurred vision in left eye; unsuccessful   KNEE ARTHROSCOPY  bilat; several surgeries   SHOULDER ARTHROSCOPY     rotator cuff, reattach part of long bicep, Dr Levorn   Social History:  Social History   Socioeconomic History   Marital status: Married    Spouse name: Not on file   Number of children: Not on file   Years of education: Not on file   Highest education level: Not on file  Occupational History   Not on file  Tobacco Use   Smoking status: Never   Smokeless tobacco: Never  Vaping Use   Vaping status: Never Used  Substance  and Sexual Activity   Alcohol use: Yes    Comment: 2-3 times weekly, beer   Drug use: No   Sexual activity: Not on file  Other Topics Concern   Not on file  Social History Narrative   Not on file   Social Drivers of Health   Financial Resource Strain: Low Risk  (11/22/2023)   Received from [facility]   Overall Financial Resource Strain (CARDIA)    Difficulty of Paying Living Expenses: Not hard at all  Food Insecurity: No Food Insecurity (11/22/2023)   Received from [facility]   Hunger Vital Sign    Within the past 12 months, you worried that your food would run out before you got the money to buy more.: Never true    Within the past 12 months, the food you bought just didn't last and you didn't have money to get more.: Never true  Transportation Needs: No Transportation Needs (11/22/2023)   Received from [facility] - Transportation    In the past 12 months, has lack of transportation kept you from medical appointments or from getting medications?: No    Lack of Transportation (Non-Medical): No  Physical Activity: Sufficiently Active (11/17/2023)   Received from [facility]   Exercise Vital Sign    On average, how many days per week do you engage in moderate to strenuous exercise (like a brisk walk)?: 5 days    On average, how many minutes do you engage in exercise at this level?: 60 min  Stress: Stress Concern Present (11/17/2023)   Received from [facility] - Occupational Stress Questionnaire    Feeling of Stress : Very much  Social Connections: Socially Integrated (11/17/2023)   Received from [facility]   Social Network    How would you rate your social network (family, work, friends)?: Good participation with social networks  Intimate Partner Violence: Not At Risk (11/17/2023)   Received from [facility]   HITS    Over the last 12 months how often did  your partner physically hurt you?: Never    Over the last 12 months how often did your partner insult you or talk down to you?: Never    Over the last 12 months how often did your partner threaten you with physical harm?: Never    Over the last 12 months how often did your partner scream or curse at you?: Never   Family History:  Family History  Problem Relation Age of Onset   Cancer Mother        breast   Cancer Father        lung   Colon cancer Neg Hx    Colon polyps Neg Hx    Esophageal cancer Neg Hx    Stomach cancer Neg Hx    Rectal cancer Neg Hx  Review of Systems: Constitutional: Doesn't report fevers, chills or abnormal weight loss Eyes: Doesn't report blurriness of vision Ears, nose, mouth, throat, and face: Doesn't report sore throat Respiratory: Doesn't report cough, dyspnea or wheezes Cardiovascular: Doesn't report palpitation, chest discomfort  Gastrointestinal:  Doesn't report nausea, constipation, diarrhea GU: Doesn't report incontinence Skin: Doesn't report skin rashes Neurological: Per HPI Musculoskeletal: Doesn't report joint pain Behavioral/Psych: Doesn't report anxiety  Physical Exam: Vitals:   05/27/24 0935  BP: 110/72  Pulse: (!) 53  Resp: 20  Temp: 97.9 F (36.6 C)  SpO2: 98%   KPS: 80. General: Alert, cooperative, pleasant, in no acute distress Head: Normal EENT: No conjunctival injection or scleral icterus.  Lungs: Resp effort normal Cardiac: Regular rate Abdomen: Non-distended abdomen Skin: No rashes cyanosis or petechiae. Extremities: No clubbing or edema  Neurologic Exam: Mental Status: Awake, alert, attentive to examiner. Oriented to self and environment. Language is fluent with intact comprehension.  Cranial Nerves: Visual acuity is grossly normal. Visual fields are full. Extra-ocular movements intact. No ptosis. Face is symmetric Motor: Tone and bulk are normal. Power is full in both arms and legs. Reflexes are symmetric, no  pathologic reflexes present.  Sensory: Intact to light touch Gait: Normal.   Labs: I have reviewed the data as listed    Component Value Date/Time   NA 140 05/03/2024 1002   NA 140 10/18/2022 0949   K 4.2 05/03/2024 1002   CL 107 05/03/2024 1002   CO2 28 05/03/2024 1002   GLUCOSE 75 05/03/2024 1002   BUN 17 05/03/2024 1002   BUN 21 10/18/2022 0949   CREATININE 0.93 05/03/2024 1002   CREATININE 0.91 05/31/2013 1310   CALCIUM  9.4 05/03/2024 1002   PROT 6.8 05/03/2024 1002   PROT 6.3 10/18/2022 0949   ALBUMIN 4.2 05/03/2024 1002   ALBUMIN 4.0 10/18/2022 0949   AST 21 05/03/2024 1002   ALT 20 05/03/2024 1002   ALKPHOS 77 05/03/2024 1002   BILITOT 0.5 05/03/2024 1002   GFRNONAA >60 05/03/2024 1002   GFRNONAA >89 05/31/2013 1310   GFRAA 88 06/02/2020 1444   GFRAA >89 05/31/2013 1310   Lab Results  Component Value Date   WBC 4.5 05/27/2024   NEUTROABS 2.5 05/27/2024   HGB 13.4 05/27/2024   HCT 39.9 05/27/2024   MCV 86.0 05/27/2024   PLT 241 05/27/2024    Imaging:  [facility] Clinician Interpretation: I have personally reviewed the CNS images as listed.  My interpretation, in the context of the patient's clinical presentation, is stable disease external scan  No results found.  Assessment/Plan Glioblastoma multiforme of occipital lobe (HCC)  Trevor Contreras is clinically stable today, now having completed cycle #3 5-day Temodar .  Labs are within normal limits today.  We recommended initiating treatment with cycle #4 Temozolomide  200 mg/m2, on for five days and off for twenty three days in twenty eight day cycles. The patient will have a complete blood count performed on days 21 and 28 of each cycle, and a comprehensive metabolic panel performed on day 28 of each cycle. Labs may need to be performed more often. Zofran  will prescribed for home use for nausea/vomiting.   Informed consent was obtained verbally at bedside to proceed with oral chemotherapy.  Chemotherapy should be  held for the following:  ANC less than 1,000  Platelets less than 100,000  LFT or creatinine greater than 2x ULN  If clinical concerns/contraindications develop  Agreeable with trial of reduced Vimpat  to 50mg  BID.  We ask that  Trevor Contreras return to clinic in 1 month with MRI brain prior to cycle #5, or sooner as needed.  All questions were answered. The patient knows to call the clinic with any problems, questions or concerns. No barriers to learning were detected.  The total time spent in the encounter was 40 minutes and more than 50% was on counseling and review of test results   Arthea MARLA Manns, MD Medical Director of Neuro-Oncology [facility] [place] 05/27/24 9:48 AM

## 2024-05-27 NOTE — Telephone Encounter (Signed)
 Oral Chemotherapy Pharmacist Encounter   Temozolomide  prescription instructions updated to include days on/off per insurance requirements. Prescriptions resent back to [facility] for dispensing.   Asberry Macintosh, PharmD, BCPS, BCOP Hematology/Oncology Clinical Pharmacist [facility] and [facility] Oral Chemotherapy [facility] Clin[facility] [phone] 05/27/2024 1:55 PM

## 2024-05-28 ENCOUNTER — Encounter: Payer: Self-pay | Admitting: Internal Medicine

## 2024-05-28 ENCOUNTER — Other Ambulatory Visit: Payer: Self-pay | Admitting: Internal Medicine

## 2024-05-28 MED ORDER — LACOSAMIDE 50 MG PO TABS: 50.0000 mg | ORAL_TABLET | Freq: Two times a day (BID) | ORAL | 2 refills | Status: DC

## 2024-05-29 ENCOUNTER — Encounter: Payer: Self-pay | Admitting: Internal Medicine

## 2024-05-31 ENCOUNTER — Ambulatory Visit: Admitting: Internal Medicine

## 2024-05-31 ENCOUNTER — Other Ambulatory Visit

## 2024-05-31 ENCOUNTER — Telehealth: Payer: Self-pay | Admitting: Internal Medicine

## 2024-05-31 NOTE — Telephone Encounter (Signed)
 Scheduled appointments per 6/26 los. Talked with the patients wife and she is aware of the made appointments for the patient.

## 2024-06-01 ENCOUNTER — Encounter: Payer: Self-pay | Admitting: Internal Medicine

## 2024-06-01 ENCOUNTER — Other Ambulatory Visit: Payer: Self-pay

## 2024-06-01 ENCOUNTER — Other Ambulatory Visit (HOSPITAL_COMMUNITY): Payer: Self-pay

## 2024-06-06 ENCOUNTER — Other Ambulatory Visit: Payer: Self-pay

## 2024-06-11 ENCOUNTER — Other Ambulatory Visit: Payer: Self-pay

## 2024-06-18 ENCOUNTER — Other Ambulatory Visit: Payer: Self-pay

## 2024-06-19 ENCOUNTER — Other Ambulatory Visit: Payer: Self-pay

## 2024-06-25 ENCOUNTER — Other Ambulatory Visit: Payer: Self-pay

## 2024-06-28 ENCOUNTER — Inpatient Hospital Stay: Attending: Internal Medicine

## 2024-06-28 ENCOUNTER — Other Ambulatory Visit: Payer: Self-pay

## 2024-06-28 ENCOUNTER — Inpatient Hospital Stay (HOSPITAL_BASED_OUTPATIENT_CLINIC_OR_DEPARTMENT_OTHER): Admitting: Internal Medicine

## 2024-06-28 ENCOUNTER — Other Ambulatory Visit: Payer: Self-pay | Admitting: Pharmacy Technician

## 2024-06-28 ENCOUNTER — Other Ambulatory Visit (HOSPITAL_COMMUNITY)

## 2024-06-28 ENCOUNTER — Ambulatory Visit (HOSPITAL_COMMUNITY)
Admission: RE | Admit: 2024-06-28 | Discharge: 2024-06-28 | Disposition: A | Discharge status: Home / Self Care | Source: Ambulatory Visit | Attending: Internal Medicine | Admitting: Internal Medicine

## 2024-06-28 VITALS — BP 114/72 | HR 51 | Temp 98.1°F | Resp 18 | Wt 202.7 lb

## 2024-06-28 DIAGNOSIS — C714 Malignant neoplasm of occipital lobe: Secondary | ICD-10-CM | POA: Diagnosis not present

## 2024-06-28 DIAGNOSIS — Z79630 Long term (current) use of alkylating agent: Secondary | ICD-10-CM | POA: Diagnosis not present

## 2024-06-28 DIAGNOSIS — C719 Malignant neoplasm of brain, unspecified: Secondary | ICD-10-CM

## 2024-06-28 DIAGNOSIS — Z79899 Other long term (current) drug therapy: Secondary | ICD-10-CM | POA: Diagnosis not present

## 2024-06-28 DIAGNOSIS — R220 Localized swelling, mass and lump, head: Secondary | ICD-10-CM | POA: Diagnosis not present

## 2024-06-28 DIAGNOSIS — Z85038 Personal history of other malignant neoplasm of large intestine: Secondary | ICD-10-CM | POA: Diagnosis not present

## 2024-06-28 LAB — CMP (CANCER CENTER ONLY)
ALT: 19 U/L (ref 0–44)
AST: 22 U/L (ref 15–41)
Albumin: 4 g/dL (ref 3.5–5.0)
Alkaline Phosphatase: 66 U/L (ref 38–126)
Anion gap: 6 (ref 5–15)
BUN: 23 mg/dL (ref 8–23)
CO2: 25 mmol/L (ref 22–32)
Calcium: 9.3 mg/dL (ref 8.9–10.3)
Chloride: 108 mmol/L (ref 98–111)
Creatinine: 0.95 mg/dL (ref 0.61–1.24)
GFR, Estimated: >60 mL/min (ref >60)
Glucose, Bld: 95 mg/dL (ref 70–99)
Potassium: 4.7 mmol/L (ref 3.5–5.1)
Sodium: 139 mmol/L (ref 135–145)
Total Bilirubin: 0.6 mg/dL (ref 0.0–1.2)
Total Protein: 6.8 g/dL (ref 6.5–8.1)

## 2024-06-28 LAB — CBC WITH DIFFERENTIAL (CANCER CENTER ONLY)
Abs Immature Granulocytes: 0.01 K/uL (ref 0.00–0.07)
Basophils Absolute: 0.1 K/uL (ref 0.0–0.1)
Basophils Relative: 2 %
Eosinophils Absolute: 0.2 K/uL (ref 0.0–0.5)
Eosinophils Relative: 4 %
HCT: 38.9 % — ABNORMAL LOW (ref 39.0–52.0)
Hemoglobin: 12.8 g/dL — ABNORMAL LOW (ref 13.0–17.0)
Immature Granulocytes: 0 %
Lymphocytes Relative: 23 %
Lymphs Abs: 1.1 K/uL (ref 0.7–4.0)
MCH: 28.8 pg (ref 26.0–34.0)
MCHC: 32.9 g/dL (ref 30.0–36.0)
MCV: 87.6 fL (ref 80.0–100.0)
Monocytes Absolute: 0.6 K/uL (ref 0.1–1.0)
Monocytes Relative: 13 %
Neutro Abs: 2.8 K/uL (ref 1.7–7.7)
Neutrophils Relative %: 58 %
Platelet Count: 192 K/uL (ref 150–400)
RBC: 4.44 MIL/uL (ref 4.22–5.81)
RDW: 14.6 % (ref 11.5–15.5)
WBC Count: 4.8 K/uL (ref 4.0–10.5)
nRBC: 0 % (ref 0.0–0.2)

## 2024-06-28 MED ORDER — TEMOZOLOMIDE 100 MG PO CAPS
200.0000 mg | ORAL_CAPSULE | Freq: Every day | ORAL | 0 refills | Status: DC
  Filled 2024-06-28: qty 10, 28d supply, fill #0

## 2024-06-28 MED ORDER — GADOBUTROL 1 MMOL/ML IV SOLN
9.0000 mL | Freq: Once | INTRAVENOUS | Status: AC | PRN
  Administered 2024-06-28: 9 mL via INTRAVENOUS

## 2024-06-28 MED ORDER — TEMOZOLOMIDE 250 MG PO CAPS
250.0000 mg | ORAL_CAPSULE | Freq: Every day | ORAL | 0 refills | Status: DC
  Filled 2024-06-28: qty 5, 28d supply, fill #0

## 2024-06-28 NOTE — Progress Notes (Signed)
 [facility] [address]  [place], [place] [address] [phone]   Interval Evaluation  Date of Service: 06/28/24 Patient Name: Trevor Contreras Patient MRN: [id number] Patient DOB: November 20, 1956 Provider: Arthea MARLA Manns, MD  Identifying Statement:  Trevor Contreras is a 68 y.o. male with right occipital glioblastoma    Oncologic History: Oncology History  Glioblastoma multiforme of occipital lobe (HCC)  11/27/2023 Surgery   Craniotomy, resection of right occipital mass with Dr. Saturnino; path is glioblastoma, IDHwt   12/29/2023 - 02/06/2024 Radiation Therapy   IMRT with concurrent Temodar  75mg /m2 Valene)   03/08/2024 -  Chemotherapy   Initiates adjuvant 5-day Temodar  150-200mg /m2     Biomarkers:  MGMT Unmethylated.  IDH 1/2 Wild type.  EGFR Unknown  TERT Unknown   Interval History: [name] presents today for follow up, now having completed cycle #4 adjuvant Temodar .  He continues to tolerate treatment well overall, denies any new symptoms.  Doing ok with the Vimpat  100mg  BID right now, but does have some fatigue and lethargy with the drug.  Denies headaches, seizures.     H+P (12/16/23) Patient presented to neurologic attention on 11/09/23 with new onset seizures, characterized as flashing lights in the left peripheral vision. CNS imaging demonstrated enhancing mass in the right occipital lobe, c/w primary tumor.  He underwent craniotomy, resection with Dr. Saturnino at [facility] on 11/27/23.  Following surgery, he has had few issues, mostly normal vision.  Is independent with activities of daily living, no further seizures.  Medications: Current Outpatient Medications on File Prior to Visit  Medication Sig Dispense Refill   lacosamide  (VIMPAT ) 50 MG TABS tablet Take 1 tablet (50 mg total) by mouth 2 (two) times daily. 60 tablet 2   lidocaine -prilocaine  (EMLA ) cream Apply 1 Application topically as needed. 30 g 0   ondansetron  (ZOFRAN ) 8 MG  tablet Take 1 tablet (8 mg total) by mouth every 8 (eight) hours as needed for nausea or vomiting. May take 30-60 minutes prior to Temodar  administration if nausea/vomiting occurs as needed. 30 tablet 1   ondansetron  (ZOFRAN ) 8 MG tablet Take 1 tablet (8 mg total) by mouth every 8 (eight) hours as needed for nausea or vomiting. May take 30-60 minutes prior to Temodar  administration if nausea/vomiting occurs as needed. 30 tablet 1   temozolomide  (TEMODAR ) 100 MG capsule Take 2 capsules (200 mg total) by mouth daily. (Take with ONE 250 mg capsule for total daily dose of 450 mg). Take for 5 days on, 23 days off. Repeat every 28 days. May take on an empty stomach to decrease nausea & vomiting. (Patient not taking: Reported on 06/28/2024) 10 capsule 0   temozolomide  (TEMODAR ) 250 MG capsule Take 1 capsule (250 mg total) by mouth daily. (Take with TWO 100 mg capsules for total daily dose of 450 mg). Take for 5 days on, 23 days off. Repeat every 28 days. May take on an empty stomach to decrease nausea & vomiting. (Patient not taking: Reported on 06/28/2024) 5 capsule 0   No current facility-administered medications on file prior to visit.    Allergies: No Known Allergies Past Medical History:  Past Medical History:  Diagnosis Date   Colon cancer (HCC) 2002   Past Surgical History:  Past Surgical History:  Procedure Laterality Date   COLON SURGERY     COLONOSCOPY     EYE SURGERY     trying to correct blurred vision in left eye; unsuccessful   KNEE ARTHROSCOPY  bilat; several surgeries   SHOULDER ARTHROSCOPY     rotator cuff, reattach part of long bicep, Dr Levorn   Social History:  Social History   Socioeconomic History   Marital status: Married    Spouse name: Not on file   Number of children: Not on file   Years of education: Not on file   Highest education level: Not on file  Occupational History   Not on file  Tobacco Use   Smoking status: Never   Smokeless tobacco: Never   Vaping Use   Vaping status: Never Used  Substance and Sexual Activity   Alcohol use: Yes    Comment: 2-3 times weekly, beer   Drug use: No   Sexual activity: Not on file  Other Topics Concern   Not on file  Social History Narrative   Not on file   Social Drivers of Health   Financial Resource Strain: Low Risk  (11/22/2023)   Received from [facility]   Overall Financial Resource Strain (CARDIA)    Difficulty of Paying Living Expenses: Not hard at all  Food Insecurity: No Food Insecurity (11/22/2023)   Received from [facility]   Hunger Vital Sign    Within the past 12 months, you worried that your food would run out before you got the money to buy more.: Never true    Within the past 12 months, the food you bought just didn't last and you didn't have money to get more.: Never true  Transportation Needs: No Transportation Needs (11/22/2023)   Received from [facility] - Transportation    In the past 12 months, has lack of transportation kept you from medical appointments or from getting medications?: No    Lack of Transportation (Non-Medical): No  Physical Activity: Sufficiently Active (11/17/2023)   Received from [facility]   Exercise Vital Sign    On average, how many days per week do you engage in moderate to strenuous exercise (like a brisk walk)?: 5 days    On average, how many minutes do you engage in exercise at this level?: 60 min  Stress: Stress Concern Present (11/17/2023)   Received from [facility] of Occupational Health - Occupational Stress Questionnaire    Feeling of Stress : Very much  Social Connections: Socially Integrated (11/17/2023)   Received from [facility]   Social Network    How would you rate your social network (family, work, friends)?: Good participation with social networks  Intimate Partner Violence: Not At Risk (11/17/2023)   Received from [facility]    HITS    Over the last 12 months how often did your partner physically hurt you?: Never    Over the last 12 months how often did your partner insult you or talk down to you?: Never    Over the last 12 months how often did your partner threaten you with physical harm?: Never    Over the last 12 months how often did your partner scream or curse at you?: Never   Family History:  Family History  Problem Relation Age of Onset   Cancer Mother        breast   Cancer Father        lung   Colon cancer Neg Hx    Colon polyps Neg Hx    Esophageal cancer Neg Hx    Stomach cancer Neg Hx    Rectal cancer Neg Hx  Review of Systems: Constitutional: Doesn't report fevers, chills or abnormal weight loss Eyes: Doesn't report blurriness of vision Ears, nose, mouth, throat, and face: Doesn't report sore throat Respiratory: Doesn't report cough, dyspnea or wheezes Cardiovascular: Doesn't report palpitation, chest discomfort  Gastrointestinal:  Doesn't report nausea, constipation, diarrhea GU: Doesn't report incontinence Skin: Doesn't report skin rashes Neurological: Per HPI Musculoskeletal: Doesn't report joint pain Behavioral/Psych: Doesn't report anxiety  Physical Exam: Vitals:   06/28/24 1248  BP: 114/72  Pulse: (!) 51  Resp: 18  Temp: 98.1 F (36.7 C)  SpO2: 100%   KPS: 80. General: Alert, cooperative, pleasant, in no acute distress Head: Normal EENT: No conjunctival injection or scleral icterus.  Lungs: Resp effort normal Cardiac: Regular rate Abdomen: Non-distended abdomen Skin: No rashes cyanosis or petechiae. Extremities: No clubbing or edema  Neurologic Exam: Mental Status: Awake, alert, attentive to examiner. Oriented to self and environment. Language is fluent with intact comprehension.  Cranial Nerves: Visual acuity is grossly normal. Visual fields are full. Extra-ocular movements intact. No ptosis. Face is symmetric Motor: Tone and bulk are normal. Power is full in  both arms and legs. Reflexes are symmetric, no pathologic reflexes present.  Sensory: Intact to light touch Gait: Normal.   Labs: I have reviewed the data as listed    Component Value Date/Time   NA 139 06/28/2024 1137   NA 140 10/18/2022 0949   K 4.7 06/28/2024 1137   CL 108 06/28/2024 1137   CO2 25 06/28/2024 1137   GLUCOSE 95 06/28/2024 1137   BUN 23 06/28/2024 1137   BUN 21 10/18/2022 0949   CREATININE 0.95 06/28/2024 1137   CREATININE 0.91 05/31/2013 1310   CALCIUM  9.3 06/28/2024 1137   PROT 6.8 06/28/2024 1137   PROT 6.3 10/18/2022 0949   ALBUMIN 4.0 06/28/2024 1137   ALBUMIN 4.0 10/18/2022 0949   AST 22 06/28/2024 1137   ALT 19 06/28/2024 1137   ALKPHOS 66 06/28/2024 1137   BILITOT 0.6 06/28/2024 1137   GFRNONAA >60 06/28/2024 1137   GFRNONAA >89 05/31/2013 1310   GFRAA 88 06/02/2020 1444   GFRAA >89 05/31/2013 1310   Lab Results  Component Value Date   WBC 4.8 06/28/2024   NEUTROABS 2.8 06/28/2024   HGB 12.8 (L) 06/28/2024   HCT 38.9 (L) 06/28/2024   MCV 87.6 06/28/2024   PLT 192 06/28/2024    Imaging:  [facility] Clinician Interpretation: I have personally reviewed the CNS images as listed.  My interpretation, in the context of the patient's clinical presentation, is stable disease  MR BRAIN W WO CONTRAST Result Date: 06/28/2024 CLINICAL DATA:  Brain/CNS neoplasm, assess treatment response EXAM: MRI HEAD WITHOUT AND WITH CONTRAST TECHNIQUE: Multiplanar, multiecho pulse sequences of the brain and surrounding structures were obtained without and with intravenous contrast. CONTRAST:  9mL GADAVIST  GADOBUTROL  1 MMOL/ML IV SOLN COMPARISON:  MRI of the brain dated Apr 29, 2024. FINDINGS: Brain: An irregularly enhancing mass is again demonstrated at the right temporal occipital junction along the medial surface of the posterior horn of the right lateral ventricle. The lesion is difficult to accurately measure and compare secondary to its morphology, but it appears similar  in size, measuring approximately 2.4 x 2.3 x 0.9 cm. There is enhancement of the ependymal surface of the lateral ventricle. There is mild hemosiderin staining present. There is no restricted diffusion of the lesion. There is increased T2 signal again demonstrated in the adjacent cerebral white matter of the occipital and temporal lobes. There is no  significant mass effect or midline shift. There are numerous foci of increased T2 signal present throughout the cerebral white matter. Vascular: Normal flow voids Skull and upper cervical spine: Normal marrow signal. Status post right occipital craniotomy. Sinuses/Orbits: Clear paranasal sinuses. Status post left lens replacement. Other: None. IMPRESSION: 1. Stable irregularly enhancing lesion at the right temporal occipital junction compatible with glioblastoma multiforme. Electronically Signed   By: Evalene Coho M.D.   On: 06/28/2024 11:55    Assessment/Plan Glioblastoma multiforme of occipital lobe (HCC)  Heath Tesler is clinically stable today, now having completed cycle #4 5-day Temodar .  MRI brain demonstrates stable findings overall.  We recommended continuing treatment with cycle #5 Temozolomide  200 mg/m2, on for five days and off for twenty three days in twenty eight day cycles. The patient will have a complete blood count performed on days 21 and 28 of each cycle, and a comprehensive metabolic panel performed on day 28 of each cycle. Labs may need to be performed more often. Zofran  will prescribed for home use for nausea/vomiting.   Informed consent was obtained verbally at bedside to proceed with oral chemotherapy.  Chemotherapy should be held for the following:  ANC less than 1,000  Platelets less than 100,000  LFT or creatinine greater than 2x ULN  If clinical concerns/contraindications develop  Agreeable with trial of reduced Vimpat  to 50mg  BID.  We ask that Clete Kuch return to clinic in 1 month with labs prior to cycle #6, or  sooner as needed.  Next MRI brain should follow cycle #6.  All questions were answered. The patient knows to call the clinic with any problems, questions or concerns. No barriers to learning were detected.  The total time spent in the encounter was 40 minutes and more than 50% was on counseling and review of test results   Arthea MARLA Manns, MD Medical Director of Neuro-Oncology [facility] at [place] [facility] 06/28/24 12:54 PM

## 2024-06-28 NOTE — Progress Notes (Signed)
 Specialty Pharmacy Refill Coordination Note  Trevor Contreras is a 68 y.o. male contacted today regarding refills of specialty medication(s) Temozolomide  (TEMODAR )   Patient requested Delivery   Delivery date: 06/30/24   Verified address: [address]  [place] [address]   Medication will be filled on 06/29/24.  Spoke to both [name]. Patient is requesting to receive asap before leaving out of town on 8/2.

## 2024-06-29 ENCOUNTER — Other Ambulatory Visit (HOSPITAL_COMMUNITY): Payer: Self-pay

## 2024-06-29 NOTE — Progress Notes (Signed)
 Specialty Pharmacy Ongoing Clinical Assessment Note  Trevor Contreras is a 68 y.o. male who is being followed by the specialty pharmacy service for RxSp Oncology   Patient's specialty medication(s) reviewed today: Temozolomide  (TEMODAR )   Missed doses in the last 4 weeks: 0   Patient/Caregiver did not have any additional questions or concerns.   Therapeutic benefit summary: Patient is achieving benefit   Adverse events/side effects summary: Experienced adverse events/side effects (fatigue, tolerable at this time)   Patient's therapy is appropriate to: Continue    Goals Addressed             This Visit's Progress    Slow Disease Progression       Patient is on track. Patient will maintain adherence.  Dr. Buckley reports that the recent MRI demonstrates stable findings (from office visit on 06/28/24).         Follow up: 3 months  Silvano LOISE Dolly Specialty Pharmacist    Clinical Intervention Note  Clinical Intervention Notes: I spoke with the patient's wife and conducted a thorough review of all current medications, including several that were recently added. During the review, I identified multiple drug interactions:  Cimetidine and metformin: This combination may increase the risk of lactic acidosis, particularly concerning given the patient's high level of physical activity as an avid cyclist and exercise enthusiast.  Simvastatin and verapamil: Together, these may increase the risk of myopathy or rhabdomyolysis.  Simvastatin and verapamil (cardiac effects): This combination can also elevate the risk of AV block, bradycardia, and ventricular tachyarrhythmias.  The patient's wife was counseled on these potential side effects. She was encouraged to ensure the patient maintains adequate hydration and to promptly report any signs of muscle pain, muscle breakdown, heart rhythm changes, or dizziness to the healthcare provider.  There are no known drug-drug interactions with the  patient's Temodar  (temozolomide ).   Clinical Intervention Outcomes: Prevention of an adverse drug event   Silvano LOISE Dolly Karel Santa

## 2024-06-30 ENCOUNTER — Other Ambulatory Visit: Payer: Self-pay

## 2024-07-01 ENCOUNTER — Telehealth: Payer: Self-pay | Admitting: Internal Medicine

## 2024-07-01 NOTE — Telephone Encounter (Signed)
 Scheduled appointments per 7/28 los. Talked with the patients wife and she is aware of the made appointments for the patient.

## 2024-07-02 ENCOUNTER — Other Ambulatory Visit: Payer: Self-pay

## 2024-07-05 ENCOUNTER — Encounter: Payer: Self-pay | Admitting: Internal Medicine

## 2024-07-14 ENCOUNTER — Encounter: Payer: Self-pay | Admitting: Internal Medicine

## 2024-07-22 ENCOUNTER — Telehealth: Payer: Self-pay | Admitting: Family Medicine

## 2024-07-22 NOTE — Telephone Encounter (Signed)
 SCHEDULED FIRST AVAILABLE APPT AND PUT PT ON WAITLIST

## 2024-07-22 NOTE — Telephone Encounter (Signed)
 This patient will need to be seen in the office with pcp he has not been seen in a while please schedule    Copied from CRM #[id number]. Topic: Clinical - Request for Lab/Test Order >> Jul 22, 2024  8:45 AM Wyona SQUIBB wrote: Reason for CRM: Pt wife Iris called in and requested a lab test for thyroid , they want everything checked for thyroid  in a blood test.    Pt callback for questions [phone], or reach out to pt to schedule if ordered.

## 2024-07-23 ENCOUNTER — Other Ambulatory Visit: Payer: Self-pay

## 2024-07-26 ENCOUNTER — Ambulatory Visit (INDEPENDENT_AMBULATORY_CARE_PROVIDER_SITE_OTHER): Admitting: Family

## 2024-07-26 ENCOUNTER — Other Ambulatory Visit: Payer: Self-pay

## 2024-07-26 ENCOUNTER — Encounter: Payer: Self-pay | Admitting: Family

## 2024-07-26 VITALS — BP 126/79 | HR 98 | Temp 98.2°F | Ht 73.0 in | Wt 208.4 lb

## 2024-07-26 DIAGNOSIS — Z0001 Encounter for general adult medical examination with abnormal findings: Secondary | ICD-10-CM | POA: Diagnosis not present

## 2024-07-26 DIAGNOSIS — C714 Malignant neoplasm of occipital lobe: Secondary | ICD-10-CM

## 2024-07-26 DIAGNOSIS — Z0000 Encounter for general adult medical examination without abnormal findings: Secondary | ICD-10-CM

## 2024-07-26 DIAGNOSIS — R946 Abnormal results of thyroid function studies: Secondary | ICD-10-CM | POA: Diagnosis not present

## 2024-07-26 NOTE — Patient Instructions (Signed)

## 2024-07-26 NOTE — Progress Notes (Signed)
 Subjective:    Patient ID: Trevor Contreras, male    DOB: 25-Jun-1956, 68 y.o.   MRN: [id number]  Chief Complaint  Patient presents with   Annual Exam    HPI PT presents to the office today for CPE. He has giloblastoma and followed by Oncologists monthly. Completed chemo and radiation. Continues to get maintenance Chemo monthly.   Followed by [facility] every 2 months. Has hx of colon cancer and gets colonoscopy every 5 years.   He reports he was told his thyroid  was abnormal and wants this rechecked today with a full thyroid  panel.   Review of Systems  All other systems reviewed and are negative.   Family History  Problem Relation Age of Onset   Cancer Mother        breast   Cancer Father        lung   Colon cancer Neg Hx    Colon polyps Neg Hx    Esophageal cancer Neg Hx    Stomach cancer Neg Hx    Rectal cancer Neg Hx    Social History   Socioeconomic History   Marital status: Married    Spouse name: Not on file   Number of children: Not on file   Years of education: Not on file   Highest education level: Bachelor's degree (e.g., BA, AB, BS)  Occupational History   Not on file  Tobacco Use   Smoking status: Never   Smokeless tobacco: Never  Vaping Use   Vaping status: Never Used  Substance and Sexual Activity   Alcohol use: Yes    Comment: 2-3 times weekly, beer   Drug use: No   Sexual activity: Not on file  Other Topics Concern   Not on file  Social History Narrative   Not on file   Social Drivers of Health   Financial Resource Strain: Low Risk  (07/22/2024)   Overall Financial Resource Strain (CARDIA)    Difficulty of Paying Living Expenses: Not hard at all  Food Insecurity: No Food Insecurity (07/22/2024)   Hunger Vital Sign    Worried About Running Out of Food in the Last Year: Never true    Ran Out of Food in the Last Year: Never true  Transportation Needs: No Transportation Needs (07/22/2024)   PRAPARE - Scientist, research (physical sciences) (Medical): No    Lack of Transportation (Non-Medical): No  Physical Activity: Sufficiently Active (07/22/2024)   Exercise Vital Sign    Days of Exercise per Week: 6 days    Minutes of Exercise per Session: 80 min  Stress: Stress Concern Present (07/22/2024)   [employer] of Occupational Health - Occupational Stress Questionnaire    Feeling of Stress: To some extent  Social Connections: Moderately Integrated (07/22/2024)   Social Connection and Isolation Panel    Frequency of Communication with Friends and Family: More than three times a week    Frequency of Social Gatherings with Friends and Family: Once a week    Attends Religious Services: 1 to 4 times per year    Active Member of [employer] or Organizations: No    Attends Engineer, structural: Not on file    Marital Status: Married       Objective:   Physical Exam Vitals reviewed.  Constitutional:      General: He is not in acute distress.    Appearance: He is well-developed.  HENT:     Head: Normocephalic.     Right  Ear: Tympanic membrane normal.     Left Ear: Tympanic membrane normal.  Eyes:     General:        Right eye: No discharge.        Left eye: No discharge.     Pupils: Pupils are equal, round, and reactive to light.  Neck:     Thyroid : No thyromegaly.  Cardiovascular:     Rate and Rhythm: Normal rate and regular rhythm.     Heart sounds: Normal heart sounds. No murmur heard. Pulmonary:     Effort: Pulmonary effort is normal. No respiratory distress.     Breath sounds: Normal breath sounds. No wheezing.  Abdominal:     General: Bowel sounds are normal. There is no distension.     Palpations: Abdomen is soft.     Tenderness: There is no abdominal tenderness.  Musculoskeletal:        General: No tenderness. Normal range of motion.     Cervical back: Normal range of motion and neck supple.  Skin:    General: Skin is warm and dry.     Findings: No erythema or rash.  Neurological:      Mental Status: He is alert and oriented to person, place, and time.     Cranial Nerves: No cranial nerve deficit.     Deep Tendon Reflexes: Reflexes are normal and symmetric.  Psychiatric:        Behavior: Behavior normal.        Thought Content: Thought content normal.        Judgment: Judgment normal.       BP 126/79   Pulse 98   Temp 98.2 F (36.8 C) (Temporal)   Ht 6' 1 (1.854 m)   Wt 208 lb 6.4 oz (94.5 kg)   SpO2 98%   BMI 27.50 kg/m      Assessment & Plan:   Trevor Contreras comes in today with chief complaint of Annual Exam   Diagnosis and orders addressed:  1. Annual physical exam (Primary) - CMP14+EGFR - Lipid panel - PSA, total and free - Thyroid  Panel With TSH - PTH, Intact and Calcium  - Thyroid  Peroxidase Antibody - Anti-TPO Ab (RDL)  2. Glioblastoma multiforme of occipital lobe (HCC) - CMP14+EGFR  3. Abnormal thyroid  function test - CMP14+EGFR - Thyroid  Panel With TSH - PTH, Intact and Calcium  - Thyroid  Peroxidase Antibody - Anti-TPO Ab (RDL)  Labs pending Continue current medications  Keep follow up with specialists  Health Maintenance reviewed Diet and exercise encouraged  Follow up plan: 1 year    Bari Learn, FNP

## 2024-07-27 ENCOUNTER — Ambulatory Visit: Payer: Self-pay | Admitting: Family

## 2024-07-27 ENCOUNTER — Inpatient Hospital Stay (HOSPITAL_BASED_OUTPATIENT_CLINIC_OR_DEPARTMENT_OTHER): Admitting: Internal Medicine

## 2024-07-27 ENCOUNTER — Telehealth: Payer: Self-pay | Admitting: Internal Medicine

## 2024-07-27 ENCOUNTER — Other Ambulatory Visit: Payer: Self-pay

## 2024-07-27 ENCOUNTER — Other Ambulatory Visit: Payer: Self-pay | Admitting: Pharmacy Technician

## 2024-07-27 ENCOUNTER — Inpatient Hospital Stay: Attending: Internal Medicine

## 2024-07-27 ENCOUNTER — Encounter: Payer: Self-pay | Admitting: Internal Medicine

## 2024-07-27 VITALS — BP 113/78 | HR 49 | Temp 97.2°F | Resp 17 | Ht 73.0 in | Wt 206.0 lb

## 2024-07-27 DIAGNOSIS — Z79630 Long term (current) use of alkylating agent: Secondary | ICD-10-CM | POA: Diagnosis not present

## 2024-07-27 DIAGNOSIS — Z85038 Personal history of other malignant neoplasm of large intestine: Secondary | ICD-10-CM | POA: Insufficient documentation

## 2024-07-27 DIAGNOSIS — Z803 Family history of malignant neoplasm of breast: Secondary | ICD-10-CM | POA: Diagnosis not present

## 2024-07-27 DIAGNOSIS — C714 Malignant neoplasm of occipital lobe: Secondary | ICD-10-CM | POA: Diagnosis not present

## 2024-07-27 DIAGNOSIS — C719 Malignant neoplasm of brain, unspecified: Secondary | ICD-10-CM

## 2024-07-27 DIAGNOSIS — Z79899 Other long term (current) drug therapy: Secondary | ICD-10-CM | POA: Diagnosis not present

## 2024-07-27 DIAGNOSIS — Z801 Family history of malignant neoplasm of trachea, bronchus and lung: Secondary | ICD-10-CM | POA: Insufficient documentation

## 2024-07-27 LAB — CMP (CANCER CENTER ONLY)
ALT: 18 U/L (ref 0–44)
AST: 21 U/L (ref 15–41)
Albumin: 3.9 g/dL (ref 3.5–5.0)
Alkaline Phosphatase: 67 U/L (ref 38–126)
Anion gap: 5 (ref 5–15)
BUN: 18 mg/dL (ref 8–23)
CO2: 28 mmol/L (ref 22–32)
Calcium: 9.1 mg/dL (ref 8.9–10.3)
Chloride: 107 mmol/L (ref 98–111)
Creatinine: 1.01 mg/dL (ref 0.61–1.24)
GFR, Estimated: >60 mL/min (ref >60)
Glucose, Bld: 63 mg/dL — ABNORMAL LOW (ref 70–99)
Potassium: 3.9 mmol/L (ref 3.5–5.1)
Sodium: 140 mmol/L (ref 135–145)
Total Bilirubin: 0.8 mg/dL (ref 0.0–1.2)
Total Protein: 6.2 g/dL — ABNORMAL LOW (ref 6.5–8.1)

## 2024-07-27 LAB — CBC WITH DIFFERENTIAL (CANCER CENTER ONLY)
Abs Immature Granulocytes: 0 K/uL (ref 0.00–0.07)
Basophils Absolute: 0.1 K/uL (ref 0.0–0.1)
Basophils Relative: 2 %
Eosinophils Absolute: 0.1 K/uL (ref 0.0–0.5)
Eosinophils Relative: 2 %
HCT: 37.4 % — ABNORMAL LOW (ref 39.0–52.0)
Hemoglobin: 12.4 g/dL — ABNORMAL LOW (ref 13.0–17.0)
Immature Granulocytes: 0 %
Lymphocytes Relative: 31 %
Lymphs Abs: 1.3 K/uL (ref 0.7–4.0)
MCH: 29.5 pg (ref 26.0–34.0)
MCHC: 33.2 g/dL (ref 30.0–36.0)
MCV: 88.8 fL (ref 80.0–100.0)
Monocytes Absolute: 0.6 K/uL (ref 0.1–1.0)
Monocytes Relative: 14 %
Neutro Abs: 2.2 K/uL (ref 1.7–7.7)
Neutrophils Relative %: 51 %
Platelet Count: 179 K/uL (ref 150–400)
RBC: 4.21 MIL/uL — ABNORMAL LOW (ref 4.22–5.81)
RDW: 14.6 % (ref 11.5–15.5)
WBC Count: 4.4 K/uL (ref 4.0–10.5)
nRBC: 0 % (ref 0.0–0.2)

## 2024-07-27 LAB — CMP14+EGFR
ALT: 22 IU/L (ref 0–44)
AST: 20 IU/L (ref 0–40)
Albumin: 4.4 g/dL (ref 3.9–4.9)
Alkaline Phosphatase: 81 IU/L (ref 44–121)
BUN/Creatinine Ratio: 17 (ref 10–24)
BUN: 17 mg/dL (ref 8–27)
Bilirubin Total: 0.4 mg/dL (ref 0.0–1.2)
CO2: 19 mmol/L — ABNORMAL LOW (ref 20–29)
Calcium: 9.8 mg/dL (ref 8.6–10.2)
Chloride: 105 mmol/L (ref 96–106)
Creatinine, Ser: 0.99 mg/dL (ref 0.76–1.27)
Globulin, Total: 2.1 g/dL (ref 1.5–4.5)
Glucose: 69 mg/dL — ABNORMAL LOW (ref 70–99)
Potassium: 4.4 mmol/L (ref 3.5–5.2)
Sodium: 143 mmol/L (ref 134–144)
Total Protein: 6.5 g/dL (ref 6.0–8.5)
eGFR: 82 mL/min/1.73 (ref >59)

## 2024-07-27 LAB — LIPID PANEL
Chol/HDL Ratio: 2.1 ratio (ref 0.0–5.0)
Cholesterol, Total: 178 mg/dL (ref 100–199)
HDL: 83 mg/dL (ref >39)
LDL Chol Calc (NIH): 83 mg/dL (ref 0–99)
Triglycerides: 63 mg/dL (ref 0–149)
VLDL Cholesterol Cal: 12 mg/dL (ref 5–40)

## 2024-07-27 LAB — THYROID PANEL WITH TSH
Free Thyroxine Index: 1.6 (ref 1.2–4.9)
T3 Uptake Ratio: 26 % (ref 24–39)
T4, Total: 6.1 ug/dL (ref 4.5–12.0)
TSH: 3.52 u[IU]/mL (ref 0.450–4.500)

## 2024-07-27 LAB — PSA, TOTAL AND FREE
PSA, Free Pct: 23.3 %
PSA, Free: 0.21 ng/mL
Prostate Specific Ag, Serum: 0.9 ng/mL (ref 0.0–4.0)

## 2024-07-27 LAB — ANTI-TPO AB (RDL)

## 2024-07-27 LAB — PTH, INTACT AND CALCIUM: PTH: 14 pg/mL — ABNORMAL LOW (ref 15–65)

## 2024-07-27 LAB — THYROID PEROXIDASE ANTIBODY: Thyroperoxidase Ab SerPl-aCnc: 62 [IU]/mL — ABNORMAL HIGH (ref 0–34)

## 2024-07-27 MED ORDER — LACOSAMIDE 50 MG PO TABS: 50.0000 mg | ORAL_TABLET | Freq: Two times a day (BID) | ORAL | 2 refills | Status: DC

## 2024-07-27 MED ORDER — TEMOZOLOMIDE 100 MG PO CAPS
200.0000 mg | ORAL_CAPSULE | Freq: Every day | ORAL | 0 refills | Status: DC
  Filled 2024-07-27: qty 10, 28d supply, fill #0

## 2024-07-27 MED ORDER — TEMOZOLOMIDE 250 MG PO CAPS
250.0000 mg | ORAL_CAPSULE | Freq: Every day | ORAL | 0 refills | Status: DC
  Filled 2024-07-27: qty 5, 28d supply, fill #0

## 2024-07-27 NOTE — Progress Notes (Signed)
 [facility] [address]  [place], [place] [address] [phone]   Interval Evaluation  Date of Service: 07/27/24 Patient Name: Trevor Contreras Patient MRN: [id number] Patient DOB: 1955/12/13 Provider: Arthea MARLA Manns, MD  Identifying Statement:  Trevor Contreras is a 68 y.o. male with right occipital glioblastoma    Oncologic History: Oncology History  Glioblastoma multiforme of occipital lobe (HCC)  11/27/2023 Surgery   Craniotomy, resection of right occipital mass with Dr. Saturnino; path is glioblastoma, IDHwt   12/29/2023 - 02/06/2024 Radiation Therapy   IMRT with concurrent Temodar  75mg /m2 Valene)   03/08/2024 -  Chemotherapy   Initiates adjuvant 5-day Temodar  150-200mg /m2     Biomarkers:  MGMT Unmethylated.  IDH 1/2 Wild type.  EGFR Unknown  TERT Unknown   Interval History: Trevor Contreras presents today for follow up, now having completed cycle #5 adjuvant Temodar .  He continues to tolerate treatment well overall, denies any new symptoms.  Doing ok with the Vimpat  50mg  BID right now, but does have some fatigue and lethargy with the drug.  Denies headaches, seizures.    H+P (12/16/23) Patient presented to neurologic attention on 11/09/23 with new onset seizures, characterized as flashing lights in the left peripheral vision. CNS imaging demonstrated enhancing mass in the right occipital lobe, c/w primary tumor.  He underwent craniotomy, resection with Dr. Saturnino at [facility] on 11/27/23.  Following surgery, he has had few issues, mostly normal vision.  Is independent with activities of daily living, no further seizures.  Medications: Current Outpatient Medications on File Prior to Visit  Medication Sig Dispense Refill   celecoxib (CELEBREX) 100 MG capsule Take 100 mg by mouth 2 (two) times daily.     cimetidine (TAGAMET) 400 MG tablet Take 200 mg by mouth 2 (two) times daily.     doxycycline (MONODOX) 100 MG capsule Take 100 mg by mouth 2  (two) times daily.     IVERMECTIN PO Take 30 mg by mouth daily.     lacosamide  (VIMPAT ) 50 MG TABS tablet Take 1 tablet (50 mg total) by mouth 2 (two) times daily. 60 tablet 2   lidocaine -prilocaine  (EMLA ) cream Apply 1 Application topically as needed. 30 g 0   MEBENDAZOLE PO Take 224 mg by mouth daily.     metFORMIN (GLUCOPHAGE) 500 MG tablet Take 500 mg by mouth 2 (two) times daily.     NALTREXONE HCL PO Take 3 mg by mouth daily. Daily on weekends     ondansetron  (ZOFRAN ) 8 MG tablet Take 1 tablet (8 mg total) by mouth every 8 (eight) hours as needed for nausea or vomiting. May take 30-60 minutes prior to Temodar  administration if nausea/vomiting occurs as needed. 30 tablet 1   ondansetron  (ZOFRAN ) 8 MG tablet Take 1 tablet (8 mg total) by mouth every 8 (eight) hours as needed for nausea or vomiting. May take 30-60 minutes prior to Temodar  administration if nausea/vomiting occurs as needed. 30 tablet 1   simvastatin (ZOCOR) 20 MG tablet Take 20 mg by mouth daily.     temozolomide  (TEMODAR ) 100 MG capsule Take 2 capsules (200 mg total) by mouth daily. (Take with ONE 250 mg capsule for total daily dose of 450 mg). Take for 5 days on, 23 days off. Repeat every 28 days. May take on an empty stomach to decrease nausea & vomiting. 10 capsule 0   temozolomide  (TEMODAR ) 100 MG capsule Take 2 capsules (200 mg total) by mouth daily. (Take with ONE 250 mg  capsule for total daily dose of 450 mg). Take for 5 days on, 23 days off. Repeat every 28 days. May take on an empty stomach to decrease nausea & vomiting. 10 capsule 0   temozolomide  (TEMODAR ) 250 MG capsule Take 1 capsule (250 mg total) by mouth daily. (Take with TWO 100 mg capsules for total daily dose of 450 mg). Take for 5 days on, 23 days off. Repeat every 28 days. May take on an empty stomach to decrease nausea & vomiting. 5 capsule 0   temozolomide  (TEMODAR ) 250 MG capsule Take 1 capsule (250 mg total) by mouth daily. (Take with TWO 100 mg capsules for  total daily dose of 450 mg). Take for 5 days on, 23 days off. Repeat every 28 days. May take on an empty stomach to decrease nausea & vomiting. 5 capsule 0   verapamil (VERELAN) 100 MG 24 hr capsule Take 100 mg by mouth daily.     No current facility-administered medications on file prior to visit.    Allergies: No Known Allergies Past Medical History:  Past Medical History:  Diagnosis Date   Colon cancer (HCC) 2002   Past Surgical History:  Past Surgical History:  Procedure Laterality Date   COLON SURGERY     COLONOSCOPY     EYE SURGERY     trying to correct blurred vision in left eye; unsuccessful   KNEE ARTHROSCOPY     bilat; several surgeries   SHOULDER ARTHROSCOPY     rotator cuff, reattach part of long bicep, Dr Levorn   Social History:  Social History   Socioeconomic History   Marital status: Married    Spouse name: Not on file   Number of children: Not on file   Years of education: Not on file   Highest education level: Bachelor's degree (e.g., BA, AB, BS)  Occupational History   Not on file  Tobacco Use   Smoking status: Never   Smokeless tobacco: Never  Vaping Use   Vaping status: Never Used  Substance and Sexual Activity   Alcohol use: Yes    Comment: 2-3 times weekly, beer   Drug use: No   Sexual activity: Not on file  Other Topics Concern   Not on file  Social History Narrative   Not on file   Social Drivers of Health   Financial Resource Strain: Low Risk  (07/22/2024)   Overall Financial Resource Strain (CARDIA)    Difficulty of Paying Living Expenses: Not hard at all  Food Insecurity: No Food Insecurity (07/22/2024)   Hunger Vital Sign    Worried About Running Out of Food in the Last Year: Never true    Ran Out of Food in the Last Year: Never true  Transportation Needs: No Transportation Needs (07/22/2024)   PRAPARE - Administrator, Civil Service (Medical): No    Lack of Transportation (Non-Medical): No  Physical Activity:  Sufficiently Active (07/22/2024)   Exercise Vital Sign    Days of Exercise per Week: 6 days    Minutes of Exercise per Session: 80 min  Stress: Stress Concern Present (07/22/2024)   [employer] of Occupational Health - Occupational Stress Questionnaire    Feeling of Stress: To some extent  Social Connections: Moderately Integrated (07/22/2024)   Social Connection and Isolation Panel    Frequency of Communication with Friends and Family: More than three times a week    Frequency of Social Gatherings with Friends and Family: Once a week  Attends Religious Services: 1 to 4 times per year    Active Member of Clubs or Organizations: No    Attends Banker Meetings: Not on file    Marital Status: Married  Intimate Partner Violence: Not At Risk (11/17/2023)   Received from [facility]   HITS    Over the last 12 months how often did your partner physically hurt you?: Never    Over the last 12 months how often did your partner insult you or talk down to you?: Never    Over the last 12 months how often did your partner threaten you with physical harm?: Never    Over the last 12 months how often did your partner scream or curse at you?: Never   Family History:  Family History  Problem Relation Age of Onset   Cancer Mother        breast   Cancer Father        lung   Colon cancer Neg Hx    Colon polyps Neg Hx    Esophageal cancer Neg Hx    Stomach cancer Neg Hx    Rectal cancer Neg Hx     Review of Systems: Constitutional: Doesn't report fevers, chills or abnormal weight loss Eyes: Doesn't report blurriness of vision Ears, nose, mouth, throat, and face: Doesn't report sore throat Respiratory: Doesn't report cough, dyspnea or wheezes Cardiovascular: Doesn't report palpitation, chest discomfort  Gastrointestinal:  Doesn't report nausea, constipation, diarrhea GU: Doesn't report incontinence Skin: Doesn't report skin rashes Neurological: Per HPI Musculoskeletal:  Doesn't report joint pain Behavioral/Psych: Doesn't report anxiety  Physical Exam: Vitals:   07/27/24 1050  BP: 113/78  Pulse: (!) 49  Resp: 17  Temp: (!) 97.2 F (36.2 C)  SpO2: 100%   KPS: 80. General: Alert, cooperative, pleasant, in no acute distress Head: Normal EENT: No conjunctival injection or scleral icterus.  Lungs: Resp effort normal Cardiac: Regular rate Abdomen: Non-distended abdomen Skin: No rashes cyanosis or petechiae. Extremities: No clubbing or edema  Neurologic Exam: Mental Status: Awake, alert, attentive to examiner. Oriented to self and environment. Language is fluent with intact comprehension.  Cranial Nerves: Visual acuity is grossly normal. Visual fields are full. Extra-ocular movements intact. No ptosis. Face is symmetric Motor: Tone and bulk are normal. Power is full in both arms and legs. Reflexes are symmetric, no pathologic reflexes present.  Sensory: Intact to light touch Gait: Normal.   Labs: I have reviewed the data as listed    Component Value Date/Time   NA 143 07/26/2024 0832   K 4.4 07/26/2024 0832   CL 105 07/26/2024 0832   CO2 19 (L) 07/26/2024 0832   GLUCOSE 69 (L) 07/26/2024 0832   GLUCOSE 95 06/28/2024 1137   BUN 17 07/26/2024 0832   CREATININE 0.99 07/26/2024 0832   CREATININE 0.95 06/28/2024 1137   CREATININE 0.91 05/31/2013 1310   CALCIUM  9.8 07/26/2024 0832   PROT 6.5 07/26/2024 0832   ALBUMIN 4.4 07/26/2024 0832   AST 20 07/26/2024 0832   AST 22 06/28/2024 1137   ALT 22 07/26/2024 0832   ALT 19 06/28/2024 1137   ALKPHOS 81 07/26/2024 0832   BILITOT 0.4 07/26/2024 0832   BILITOT 0.6 06/28/2024 1137   GFRNONAA >60 06/28/2024 1137   GFRNONAA >89 05/31/2013 1310   GFRAA 88 06/02/2020 1444   GFRAA >89 05/31/2013 1310   Lab Results  Component Value Date   WBC 4.4 07/27/2024   NEUTROABS 2.2 07/27/2024   HGB 12.4 (  L) 07/27/2024   HCT 37.4 (L) 07/27/2024   MCV 88.8 07/27/2024   PLT 179 07/27/2024     Imaging:  [facility] Clinician Interpretation: I have personally reviewed the CNS images as listed.  My interpretation, in the context of the patient's clinical presentation, is stable disease  MR BRAIN W WO CONTRAST Result Date: 06/28/2024 CLINICAL DATA:  Brain/CNS neoplasm, assess treatment response EXAM: MRI HEAD WITHOUT AND WITH CONTRAST TECHNIQUE: Multiplanar, multiecho pulse sequences of the brain and surrounding structures were obtained without and with intravenous contrast. CONTRAST:  9mL GADAVIST  GADOBUTROL  1 MMOL/ML IV SOLN COMPARISON:  MRI of the brain dated Apr 29, 2024. FINDINGS: Brain: An irregularly enhancing mass is again demonstrated at the right temporal occipital junction along the medial surface of the posterior horn of the right lateral ventricle. The lesion is difficult to accurately measure and compare secondary to its morphology, but it appears similar in size, measuring approximately 2.4 x 2.3 x 0.9 cm. There is enhancement of the ependymal surface of the lateral ventricle. There is mild hemosiderin staining present. There is no restricted diffusion of the lesion. There is increased T2 signal again demonstrated in the adjacent cerebral white matter of the occipital and temporal lobes. There is no significant mass effect or midline shift. There are numerous foci of increased T2 signal present throughout the cerebral white matter. Vascular: Normal flow voids Skull and upper cervical spine: Normal marrow signal. Status post right occipital craniotomy. Sinuses/Orbits: Clear paranasal sinuses. Status post left lens replacement. Other: None. IMPRESSION: 1. Stable irregularly enhancing lesion at the right temporal occipital junction compatible with glioblastoma multiforme. Electronically Signed   By: Evalene Coho M.D.   On: 06/28/2024 11:55    Assessment/Plan Glioblastoma multiforme of occipital lobe (HCC)  Trevor Contreras is clinically stable today, now having completed cycle #5 5-day  Temodar .  Labs are within normal limits.  We recommended continuing treatment with cycle #6 Temozolomide  200 mg/m2, on for five days and off for twenty three days in twenty eight day cycles. The patient will have a complete blood count performed on days 21 and 28 of each cycle, and a comprehensive metabolic panel performed on day 28 of each cycle. Labs may need to be performed more often. Zofran  will prescribed for home use for nausea/vomiting.   Informed consent was obtained verbally at bedside to proceed with oral chemotherapy.  Chemotherapy should be held for the following:  ANC less than 1,000  Platelets less than 100,000  LFT or creatinine greater than 2x ULN  If clinical concerns/contraindications develop  Agreeable with continuing Vimpat  50mg  BID.  We ask that Trevor Contreras return to clinic in 1 month with MRI brain prior to cycle #6, or sooner as needed.    All questions were answered. The patient knows to call the clinic with any problems, questions or concerns. No barriers to learning were detected.  The total time spent in the encounter was 30 minutes and more than 50% was on counseling and review of test results   Arthea MARLA Manns, MD Medical Director of Neuro-Oncology [facility] 07/27/24 11:24 AM

## 2024-07-27 NOTE — Telephone Encounter (Signed)
 Scheduled appointments per 8/26 los. Talked with the patients wife Mrs.Genrich and she is aware of the made appointments for the patient.

## 2024-07-27 NOTE — Progress Notes (Signed)
 Specialty Pharmacy Refill Coordination Note  Trevor Contreras is a 68 y.o. male contacted today regarding refills of specialty medication(s) Temozolomide  (TEMODAR )  Spoke with wife.  Patient requested Delivery   Delivery date: 07/28/24   Verified address: [address]  [place]   Medication will be filled on 07/27/24.

## 2024-07-28 ENCOUNTER — Other Ambulatory Visit: Payer: Self-pay

## 2024-07-29 ENCOUNTER — Encounter: Payer: Self-pay | Admitting: Internal Medicine

## 2024-07-29 ENCOUNTER — Other Ambulatory Visit

## 2024-07-29 ENCOUNTER — Ambulatory Visit: Admitting: Internal Medicine

## 2024-08-17 ENCOUNTER — Other Ambulatory Visit (HOSPITAL_COMMUNITY): Payer: Self-pay

## 2024-08-23 ENCOUNTER — Ambulatory Visit: Admitting: Family

## 2024-08-30 ENCOUNTER — Ambulatory Visit (HOSPITAL_COMMUNITY)
Admission: RE | Admit: 2024-08-30 | Discharge: 2024-08-30 | Disposition: A | Discharge status: Home / Self Care | Source: Ambulatory Visit | Attending: Internal Medicine | Admitting: Internal Medicine

## 2024-08-30 DIAGNOSIS — C714 Malignant neoplasm of occipital lobe: Secondary | ICD-10-CM | POA: Diagnosis not present

## 2024-08-30 DIAGNOSIS — C719 Malignant neoplasm of brain, unspecified: Secondary | ICD-10-CM | POA: Diagnosis not present

## 2024-08-30 MED ORDER — GADOBUTROL 1 MMOL/ML IV SOLN
9.0000 mL | Freq: Once | INTRAVENOUS | Status: AC | PRN
  Administered 2024-08-30: 9 mL via INTRAVENOUS

## 2024-08-31 ENCOUNTER — Inpatient Hospital Stay: Attending: Internal Medicine

## 2024-08-31 ENCOUNTER — Inpatient Hospital Stay: Admitting: Internal Medicine

## 2024-08-31 ENCOUNTER — Other Ambulatory Visit: Payer: Self-pay

## 2024-08-31 ENCOUNTER — Encounter: Payer: Self-pay | Admitting: Internal Medicine

## 2024-08-31 ENCOUNTER — Encounter: Payer: Self-pay | Admitting: *Deleted

## 2024-08-31 VITALS — BP 112/70 | HR 50 | Temp 97.7°F | Resp 20 | Wt 205.8 lb

## 2024-08-31 DIAGNOSIS — Z801 Family history of malignant neoplasm of trachea, bronchus and lung: Secondary | ICD-10-CM | POA: Insufficient documentation

## 2024-08-31 DIAGNOSIS — Z923 Personal history of irradiation: Secondary | ICD-10-CM | POA: Diagnosis not present

## 2024-08-31 DIAGNOSIS — C714 Malignant neoplasm of occipital lobe: Secondary | ICD-10-CM | POA: Diagnosis not present

## 2024-08-31 DIAGNOSIS — Z9221 Personal history of antineoplastic chemotherapy: Secondary | ICD-10-CM | POA: Diagnosis not present

## 2024-08-31 DIAGNOSIS — Z79630 Long term (current) use of alkylating agent: Secondary | ICD-10-CM | POA: Insufficient documentation

## 2024-08-31 DIAGNOSIS — C719 Malignant neoplasm of brain, unspecified: Secondary | ICD-10-CM

## 2024-08-31 DIAGNOSIS — Z79899 Other long term (current) drug therapy: Secondary | ICD-10-CM | POA: Diagnosis not present

## 2024-08-31 LAB — CBC WITH DIFFERENTIAL (CANCER CENTER ONLY)
Abs Immature Granulocytes: 0.01 K/uL (ref 0.00–0.07)
Basophils Absolute: 0.1 K/uL (ref 0.0–0.1)
Basophils Relative: 2 %
Eosinophils Absolute: 0.2 K/uL (ref 0.0–0.5)
Eosinophils Relative: 3 %
HCT: 37.8 % — ABNORMAL LOW (ref 39.0–52.0)
Hemoglobin: 12.4 g/dL — ABNORMAL LOW (ref 13.0–17.0)
Immature Granulocytes: 0 %
Lymphocytes Relative: 25 %
Lymphs Abs: 1.3 K/uL (ref 0.7–4.0)
MCH: 28.9 pg (ref 26.0–34.0)
MCHC: 32.8 g/dL (ref 30.0–36.0)
MCV: 88.1 fL (ref 80.0–100.0)
Monocytes Absolute: 0.6 K/uL (ref 0.1–1.0)
Monocytes Relative: 12 %
Neutro Abs: 3 K/uL (ref 1.7–7.7)
Neutrophils Relative %: 58 %
Platelet Count: 179 K/uL (ref 150–400)
RBC: 4.29 MIL/uL (ref 4.22–5.81)
RDW: 13.8 % (ref 11.5–15.5)
WBC Count: 5.1 K/uL (ref 4.0–10.5)
nRBC: 0 % (ref 0.0–0.2)

## 2024-08-31 LAB — CMP (CANCER CENTER ONLY)
ALT: 12 U/L (ref 0–44)
AST: 15 U/L (ref 15–41)
Albumin: 4 g/dL (ref 3.5–5.0)
Alkaline Phosphatase: 76 U/L (ref 38–126)
Anion gap: 5 (ref 5–15)
BUN: 16 mg/dL (ref 8–23)
CO2: 27 mmol/L (ref 22–32)
Calcium: 9.2 mg/dL (ref 8.9–10.3)
Chloride: 107 mmol/L (ref 98–111)
Creatinine: 0.91 mg/dL (ref 0.61–1.24)
GFR, Estimated: >60 mL/min (ref >60)
Glucose, Bld: 90 mg/dL (ref 70–99)
Potassium: 3.9 mmol/L (ref 3.5–5.1)
Sodium: 139 mmol/L (ref 135–145)
Total Bilirubin: 0.6 mg/dL (ref 0.0–1.2)
Total Protein: 6.5 g/dL (ref 6.5–8.1)

## 2024-08-31 MED ORDER — TRAZODONE HCL 50 MG PO TABS: 50.0000 mg | ORAL_TABLET | Freq: Every day | ORAL | 2 refills | Status: DC

## 2024-08-31 NOTE — Progress Notes (Signed)
 Requested that images from MRI brain on 08/30/24 be pushed to [facility] through [employer], email to [employer].

## 2024-08-31 NOTE — Progress Notes (Signed)
 Current therapy has ended per clinic ok to dis-enroll.

## 2024-08-31 NOTE — Progress Notes (Signed)
 [facility] [address]  [place], [place] [address] [phone]   Interval Evaluation  Date of Service: 08/31/24 Patient Name: Trevor Contreras Patient MRN: [id number] Patient DOB: 04/25/1956 Provider: Arthea MARLA Manns, MD  Identifying Statement:  Trevor Contreras is a 68 y.o. male with right occipital glioblastoma    Oncologic History: Oncology History  Glioblastoma multiforme of occipital lobe (HCC)  11/27/2023 Surgery   Craniotomy, resection of right occipital mass with Dr. Saturnino; path is glioblastoma, IDHwt   12/29/2023 - 02/06/2024 Radiation Therapy   IMRT with concurrent Temodar  75mg /m2 Trevor Contreras)   03/08/2024 -  Chemotherapy   Initiates adjuvant 5-day Temodar  150-200mg /m2   08/31/2024 Progression   Progression of Disease #1     Biomarkers:  MGMT Unmethylated.  IDH 1/2 Wild type.  EGFR Unknown  TERT Unknown   Interval History: Trevor Contreras presents today for follow up, now having completed cycle #6 adjuvant Temodar .  He continues to tolerate treatment well overall, denies any new symptoms.  Doing ok with the Vimpat  50mg  BID.  Sleep has been poor this past month, and even prior to this.  Denies severe headaches, seizures.    H+P (12/16/23) Patient presented to neurologic attention on 11/09/23 with new onset seizures, characterized as flashing lights in the left peripheral vision. CNS imaging demonstrated enhancing mass in the right occipital lobe, c/w primary tumor.  He underwent craniotomy, resection with Dr. Saturnino at [facility] on 11/27/23.  Following surgery, he has had few issues, mostly normal vision.  Is independent with activities of daily living, no further seizures.  Medications: Current Outpatient Medications on File Prior to Visit  Medication Sig Dispense Refill   doxycycline (MONODOX) 100 MG capsule Take 100 mg by mouth 2 (two) times daily. (Patient taking differently: Take 100 mg by mouth 2 (two) times daily as needed.)      IVERMECTIN PO Take 30 mg by mouth daily.     lacosamide  (VIMPAT ) 50 MG TABS tablet Take 1 tablet (50 mg total) by mouth 2 (two) times daily. 60 tablet 2   lidocaine -prilocaine  (EMLA ) cream Apply 1 Application topically as needed. 30 g 0   MEBENDAZOLE PO Take 224 mg by mouth daily.     metFORMIN (GLUCOPHAGE) 500 MG tablet Take 500 mg by mouth 2 (two) times daily.     NALTREXONE HCL PO Take 3 mg by mouth daily. Daily on weekends     ondansetron  (ZOFRAN ) 8 MG tablet Take 1 tablet (8 mg total) by mouth every 8 (eight) hours as needed for nausea or vomiting. May take 30-60 minutes prior to Temodar  administration if nausea/vomiting occurs as needed. 30 tablet 1   ondansetron  (ZOFRAN ) 8 MG tablet Take 1 tablet (8 mg total) by mouth every 8 (eight) hours as needed for nausea or vomiting. May take 30-60 minutes prior to Temodar  administration if nausea/vomiting occurs as needed. 30 tablet 1   temozolomide  (TEMODAR ) 100 MG capsule Take 2 capsules (200 mg total) by mouth daily. (Take with ONE 250 mg capsule for total daily dose of 450 mg). Take for 5 days on, 23 days off. Repeat every 28 days. May take on an empty stomach to decrease nausea & vomiting. 10 capsule 0   temozolomide  (TEMODAR ) 100 MG capsule Take 2 capsules (200 mg total) by mouth daily. (Take with ONE 250 mg capsule for total daily dose of 450 mg). Take for 5 days on, 23 days off. Repeat every 28 days. May take on an empty  stomach to decrease nausea & vomiting. 10 capsule 0   temozolomide  (TEMODAR ) 100 MG capsule Take 2 capsules (200 mg total) by mouth daily. (Take with ONE 250 mg capsule for total daily dose of 450 mg). Take for 5 days on, 23 days off. Repeat every 28 days. May take on an empty stomach to decrease nausea & vomiting. 10 capsule 0   temozolomide  (TEMODAR ) 250 MG capsule Take 1 capsule (250 mg total) by mouth daily. (Take with TWO 100 mg capsules for total daily dose of 450 mg). Take for 5 days on, 23 days off. Repeat every 28 days. May  take on an empty stomach to decrease nausea & vomiting. 5 capsule 0   temozolomide  (TEMODAR ) 250 MG capsule Take 1 capsule (250 mg total) by mouth daily. (Take with TWO 100 mg capsules for total daily dose of 450 mg). Take for 5 days on, 23 days off. Repeat every 28 days. May take on an empty stomach to decrease nausea & vomiting. 5 capsule 0   temozolomide  (TEMODAR ) 250 MG capsule Take 1 capsule (250 mg total) by mouth daily. (Take with TWO 100 mg capsules for total daily dose of 450 mg). Take for 5 days on, 23 days off. Repeat every 28 days. May take on an empty stomach to decrease nausea & vomiting. 5 capsule 0   No current facility-administered medications on file prior to visit.    Allergies: No Known Allergies Past Medical History:  Past Medical History:  Diagnosis Date   Colon cancer (HCC) 2002   Past Surgical History:  Past Surgical History:  Procedure Laterality Date   COLON SURGERY     COLONOSCOPY     EYE SURGERY     trying to correct blurred vision in left eye; unsuccessful   KNEE ARTHROSCOPY     bilat; several surgeries   SHOULDER ARTHROSCOPY     rotator cuff, reattach part of long bicep, Dr Levorn   Social History:  Social History   Socioeconomic History   Marital status: Married    Spouse name: Not on file   Number of children: Not on file   Years of education: Not on file   Highest education level: Bachelor's degree (e.g., BA, AB, BS)  Occupational History   Not on file  Tobacco Use   Smoking status: Never   Smokeless tobacco: Never  Vaping Use   Vaping status: Never Used  Substance and Sexual Activity   Alcohol use: Yes    Comment: 2-3 times weekly, beer   Drug use: No   Sexual activity: Not on file  Other Topics Concern   Not on file  Social History Narrative   Not on file   Social Drivers of Health   Financial Resource Strain: Low Risk  (07/22/2024)   Overall Financial Resource Strain (CARDIA)    Difficulty of Paying Living Expenses: Not hard  at all  Food Insecurity: No Food Insecurity (07/22/2024)   Hunger Vital Sign    Worried About Running Out of Food in the Last Year: Never true    Ran Out of Food in the Last Year: Never true  Transportation Needs: No Transportation Needs (07/22/2024)   PRAPARE - Administrator, Civil Service (Medical): No    Lack of Transportation (Non-Medical): No  Physical Activity: Sufficiently Active (07/22/2024)   Exercise Vital Sign    Days of Exercise per Week: 6 days    Minutes of Exercise per Session: 80 min  Stress: Stress Concern  Present (07/22/2024)   [employer] of Occupational Health - Occupational Stress Questionnaire    Feeling of Stress: To some extent  Social Connections: Moderately Integrated (07/22/2024)   Social Connection and Isolation Panel    Frequency of Communication with Friends and Family: More than three times a week    Frequency of Social Gatherings with Friends and Family: Once a week    Attends Religious Services: 1 to 4 times per year    Active Member of [employer] or Organizations: No    Attends Engineer, structural: Not on file    Marital Status: Married  Catering manager Violence: Not At Risk (11/17/2023)   Received from [facility]   HITS    Over the last 12 months how often did your partner physically hurt you?: Never    Over the last 12 months how often did your partner insult you or talk down to you?: Never    Over the last 12 months how often did your partner threaten you with physical harm?: Never    Over the last 12 months how often did your partner scream or curse at you?: Never   Family History:  Family History  Problem Relation Age of Onset   Cancer Mother        breast   Cancer Father        lung   Colon cancer Neg Hx    Colon polyps Neg Hx    Esophageal cancer Neg Hx    Stomach cancer Neg Hx    Rectal cancer Neg Hx     Review of Systems: Constitutional: Doesn't report fevers, chills or abnormal weight loss Eyes: Doesn't  report blurriness of vision Ears, nose, mouth, throat, and face: Doesn't report sore throat Respiratory: Doesn't report cough, dyspnea or wheezes Cardiovascular: Doesn't report palpitation, chest discomfort  Gastrointestinal:  Doesn't report nausea, constipation, diarrhea GU: Doesn't report incontinence Skin: Doesn't report skin rashes Neurological: Per HPI Musculoskeletal: Doesn't report joint pain Behavioral/Psych: Doesn't report anxiety  Physical Exam: Vitals:   08/31/24 1020  BP: 112/70  Pulse: (!) 50  Resp: 20  Temp: 97.7 F (36.5 C)  SpO2: 98%   KPS: 80. General: Alert, cooperative, pleasant, in no acute distress Head: Normal EENT: No conjunctival injection or scleral icterus.  Lungs: Resp effort normal Cardiac: Regular rate Abdomen: Non-distended abdomen Skin: No rashes cyanosis or petechiae. Extremities: No clubbing or edema  Neurologic Exam: Mental Status: Awake, alert, attentive to examiner. Oriented to self and environment. Language is fluent with intact comprehension.  Cranial Nerves: Visual acuity is grossly normal. Visual fields are full. Extra-ocular movements intact. No ptosis. Face is symmetric Motor: Tone and bulk are normal. Power is full in both arms and legs. Reflexes are symmetric, no pathologic reflexes present.  Sensory: Intact to light touch Gait: Normal.   Labs: I have reviewed the data as listed    Component Value Date/Time   NA 140 07/27/2024 1036   NA 143 07/26/2024 0832   K 3.9 07/27/2024 1036   CL 107 07/27/2024 1036   CO2 28 07/27/2024 1036   GLUCOSE 63 (L) 07/27/2024 1036   BUN 18 07/27/2024 1036   BUN 17 07/26/2024 0832   CREATININE 1.01 07/27/2024 1036   CREATININE 0.91 05/31/2013 1310   CALCIUM  9.1 07/27/2024 1036   PROT 6.2 (L) 07/27/2024 1036   PROT 6.5 07/26/2024 0832   ALBUMIN 3.9 07/27/2024 1036   ALBUMIN 4.4 07/26/2024 0832   AST 21 07/27/2024 1036   ALT  18 07/27/2024 1036   ALKPHOS 67 07/27/2024 1036   BILITOT 0.8  07/27/2024 1036   GFRNONAA >60 07/27/2024 1036   GFRNONAA >89 05/31/2013 1310   GFRAA 88 06/02/2020 1444   GFRAA >89 05/31/2013 1310   Lab Results  Component Value Date   WBC 5.1 08/31/2024   NEUTROABS 3.0 08/31/2024   HGB 12.4 (L) 08/31/2024   HCT 37.8 (L) 08/31/2024   MCV 88.1 08/31/2024   PLT 179 08/31/2024    Imaging:  [facility] Clinician Interpretation: I have personally reviewed the CNS images as listed.  My interpretation, in the context of the patient's clinical presentation, is progressive disease  MR BRAIN W WO CONTRAST Result Date: 08/31/2024 CLINICAL DATA:  68 year old male with right occipital glioblastoma. Craniotomy in December of 2024, radiation in March of 2025. Chemotherapy. Restaging. EXAM: MRI HEAD WITHOUT AND WITH CONTRAST TECHNIQUE: Multiplanar, multiecho pulse sequences of the brain and surrounding structures were obtained without and with intravenous contrast. CONTRAST:  9mL GADAVIST  GADOBUTROL  1 MMOL/ML IV SOLN COMPARISON:  Brain MRI 06/28/2024 and earlier. FINDINGS: Brain: Progressive confluent and spiculated periventricular enhancement along the posterior right temporal and anterior right occipital horns, now encompassing an area of 37 x 26 mm on axial images (AP by transverse, previously roughly 22 x 16 mm at the same level), on coronal images 21 x 33 mm (cc by transverse) versus 12 x 24 mm at the same level in July. Corresponding progressive regional T2/FLAIR hyperintensity (series 9, image 27) and mild new mass effect on the underlying ventricle there. DWI in the area minimally more heterogeneous (series 5, image 28), and appears restricted there. Overlying craniotomy. Mild regional hemosiderin is stable. No superimposed dural thickening or enhancement. No ependymal or remote enhancement identified. No superimposed restricted diffusion suggestive of acute infarction. No midline shift, ventriculomegaly, extra-axial collection or acute intracranial hemorrhage.  Cervicomedullary junction and pituitary are within normal limits. 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 More normal for age signal in the deep gray nuclei. Scattered nonspecific cerebral white matter T2 and FLAIR hyperintensity in both hemispheres is stable. Occasional other small vessel related chronic microhemorrhage in the brain is stable (right external capsule). Vascular: Major intracranial vascular flow voids are preserved. Following contrast major dural venous sinuses are enhancing and appear to be patent. Skull and upper cervical spine: Previous craniotomy. Otherwise negative. Negative visible cervical spine and spinal cord. Sinuses/Orbits: Stable and negative. Other: Mastoids are clear. Visible internal auditory structures appear normal. Negative visible scalp and face. IMPRESSION: Since July progressive confluent and solid periventricular enhancement at the confluence of the right temporal and occipital horns, progressive regional T2/FLAIR, and mild new mass effect on the underlying ventricle. This is most suspicious for true progression of disease. Electronically Signed   By: VEAR Hurst M.D.   On: 08/31/2024 07:59    Assessment/Plan No diagnosis found.  Pesach Frisch is clinically stable today, now having completed cycle #6 5-day Temodar .  MRI brain demonstrates clear progression of disease within primary treatment site, right temporal.  There is involvement of the ependyma but no distal spread.    We recommended stopping Temozolomide  due to failure of this therapy.   We recommended discontinuing continuing treatment with Temozolomide .  We discussed options for second line therapy, including CCNU/avastin and clinical trial referral.  He will reach out to [facility] team to assess candidacy for a study.   Pending clinical trial availability at [facility], he would like to proceed with oral CCNU 90mg /m2 q6 weeks and avastin 10mg /kg  IV 2q weeks.  Avastin will be helpful as concurrent therapy given  burden of enhancing tumor and steroid requirement.  We reviewed side effects of CCNU, including fatigue, nausea vomiting, cytopenias, ILD.  We reviewed side effects of avastin, including hypertension, bleeding/clotting events, wound healing impairment.  The patient will have a complete blood count, a comprehensive metabolic panel, and urine protein performed prior to each avastin infusion. Labs may need to be performed more often. Zofran  will prescribed for home use for nausea/vomiting.    Informed consent was obtained verbally at bedside to proceed with oral chemotherapy and avastin.  Chemotherapy should be held for the following:  ANC less than 1,000  Platelets less than 100,000  LFT or creatinine greater than 2x ULN  If clinical concerns/contraindications develop   Avastin should be held for the following:  ANC less than 500  Platelets less than 50,000  LFT or creatinine greater than 2x ULN  If clinical concerns/contraindications develop  Agreeable with continuing Vimpat  50mg  BID.  Ok with trial of Trazodone 50mg  HS for sleep issues as well.  We ask that Lamar Bloomer return to clinic TBD for treatment planning following [facility] visit.   All questions were answered. The patient knows to call the clinic with any problems, questions or concerns. No barriers to learning were detected.  The total time spent in the encounter was 40 minutes and more than 50% was on counseling and review of test results   Arthea MARLA Manns, MD Medical Director of Neuro-Oncology [facility] [place] [facility] 08/31/24 10:34 AM

## 2024-09-01 ENCOUNTER — Encounter: Payer: Self-pay | Admitting: *Deleted

## 2024-09-01 NOTE — Progress Notes (Signed)
 [facility] at [facility]...there is [facility] [address], [address]  [place], [place] [address] Main Tel: [phone] Main Fax: [phone]        Interval Evaluation  Trevor Contreras MRN: [id number] Visit Date: 09/02/2024 DOB: 09-14-1956   Age:  68 y.o. Attending Physician: ROLLENE EZELLA LOUDER, MD   APP: ALMARIE NORVELL SAR, PA  Identifying Statement:  Trevor Contreras is a 68 y.o. right handed male from [place] [address] with right posterior temporal glioblastoma, IDH-wildtype, and hx of colon cancer and melanoma.    MRI for comparison this visit: 04/29/24  MRI for comparison next visit: 08/30/24   ASSESSMENT & PLAN    Right posterior temporal glioblastoma, IDH-wildtype, CNS WHO grade 4 Trevor Contreras is clinically stable overall with some increase in headaches and imbalance. His MRI shows changes consistent with disease progression. We discussed the following clinical trials at [facility] with the patient and his spouse today, and they were provided with printed materials for their consideration.  They will let us  know if they would like to screen for either study.  -[id number]: Phase 1b Clinical Trial of D2C7-IT + 2141-V11 Combination Immunotherapy Administered via Convection Enhanced Delivery in Non-Enhancing Tumor Post-Resection of Recurrent Glioblastoma, Followed by Cervical Perilymphatic Subcutaneous Injections of 2141-V11   -[id number]: A Multicenter Trial to Identify Optimal Atezolizumab Biomarkers in the Setting of Recurrent Glioblastoma. The MOAB Trial.  Enrollment on the study would require ensuring there is adequate tissue available from his biopsy on 11/11/2023.  Trial criteria preclude using tissue from his 11/27/23 resection as only the tissue from any patient's first surgery is eligible for consideration.  -Our standard of care recommendation would be for lomustine 110 mg/m p.o. every 6 weeks in the care of Dr.  Buckley.  Follow up: To be scheduled pending treatment decisions.  Jerrico Covello was advised to contact us  with any questions or concerns during the interim.  The patient is in agreeance and understanding of this plan.  All questions were answered to their apparent satisfaction.  ONCOLOGY HISTORY   Oncology History  Right posterior temporal glioblastoma  11/09/2023 Presentation   Presented to [facility] ED with visual disturbances, headache, nausea, and confusion. CT nonrevealing. MRI with right temporal lesion. Seizure during MRI.   11/11/2023 Surgery   Stereotactic biopsy by Dr. Dino Sable at [facility]   11/11/2023 Initial Diagnosis   Pathology demonstrated diffuse high grade glioma, CNS WHO grade 4     11/27/2023 Surgery   Surgical resection by Dr. Peggye Li. Pathology demonstrates glioblastoma, IDH-wildtype, CNS WHO grade 4   12/04/2023 Presentation   Patient presents to [facility] for initial evaluation. Recommend proceed radiation x6 weeks with concurrent temozolomide  75mg /m2 on days 1-42   12/29/2023 - 02/06/2024 Radiation   Chemoradiation   02/25/2024 - 07/27/2024 Chemotherapy   5-day Temodar  150-200 mg/m2 po   08/30/2024 Progression   Radiographic disease progression in the right posterior temporal lobe.   Recommend consideration of the following clinical trials at [facility]:  -[id number]: Phase 1b Clinical Trial of D2C7-IT + 2141-V11 Combination Immunotherapy Administered via Convection Enhanced Delivery in Non-Enhancing Tumor Post-Resection of Recurrent Glioblastoma, Followed by Cervical Perilymphatic Subcutaneous Injections of 2141-V11  -[id number]: A Multicenter Trial to Identify Optimal Atezolizumab Biomarkers in the Setting of Recurrent Glioblastoma. The MOAB Trial.        PROBLEM LIST   Patient Active Problem List  Diagnosis  . 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 OAG (open angle glaucoma) suspect, high risk,  left  . OAG (open angle glaucoma) suspect, low risk, right  . Glaucoma suspect, steroid responders, left  . Epiretinal membrane (ERM) of both eyes  . Combined form of age-related cataract, right eye  . Status post cataract extraction and insertion of intraocular lens of left eye  . Right posterior temporal glioblastoma  . Preoperative evaluation to rule out surgical contraindication  . Pneumonia  . Genetic Test; Uncertain Variant; Custom CancerNext Ambry Panel (12/30/2023)    PAST MEDICAL HISTORY   Past Medical History:  Diagnosis Date  . Basal cell carcinoma   . Epiretinal membrane (ERM) of left eye 08/06/2016  . History of colon cancer   . Macular degeneration   . Macular pucker, left   . Melanoma (CMS/HHS-HCC)    s/p Mohs  . Nuclear sclerotic cataract of both eyes 08/06/2016  . Vision abnormalities     SOCIAL HISTORY   Birth Control Method: The current method of family planning is none.No LMP for male patient.  Social History   Socioeconomic History  . Marital status: Married  Tobacco Use  . Smoking status: Passive Smoke Exposure - Never Smoker  . Smokeless tobacco: Never  . Tobacco comments:    PARENTS SMOKED IN HIS CHILDHOOD HOME  Vaping Use  . Vaping status: Never Used  Substance and Sexual Activity  . Alcohol use: Yes    Comment: Occasional  . Drug use: Never  . Sexual activity: Defer   Social Drivers of Health   Financial Resource Strain: Low Risk  (07/22/2024)   Received from [facility]   Overall Financial Resource Strain (CARDIA)   . How hard is it for you to pay for the very basics like food, housing, medical care, and heating?: Not hard at all  Food Insecurity: No Food Insecurity (07/22/2024)   Received from [facility]   Hunger Vital Sign   . Within the past 12 months, you worried that your food would run out before you got the money to buy more.: Never true   . Within the past 12 months, the food you bought just didn't last and you didn't have  money to get more.: Never true  Transportation Needs: No Transportation Needs (07/22/2024)   Received from [facility] - Transportation   . In the past 12 months, has lack of transportation kept you from medical appointments or from getting medications?: No   . In the past 12 months, has lack of transportation kept you from meetings, work, or from getting things needed for daily living?: No  Physical Activity: Sufficiently Active (07/22/2024)   Received from [facility]   Exercise Vital Sign   . On average, how many days per week do you engage in moderate to strenuous exercise (like a brisk walk)?: 6 days   . On average, how many minutes do you engage in exercise at this level?: 80 min  Stress: Stress Concern Present (07/22/2024)   Received from [facility] - Occupational Stress Questionnaire   . Do you feel stress - tense, restless, nervous, or anxious, or unable to sleep at night because your mind is troubled all the time - these days?: To some extent  Social Connections: Moderately Integrated (07/22/2024)   Received from [facility]   Social Connection and Isolation Panel   . In a typical week, how many times do you talk on the  phone with family, friends, or neighbors?: More than three times a week   . How often do you get together with friends or relatives?: Once a week   . How often do you attend church or religious services?: 1 to 4 times per year   . Do you belong to any clubs or organizations such as church groups, unions, fraternal or athletic groups, or school groups?: No   . Are you married, widowed, divorced, separated, never married, or living with a partner?: Married  Housing Stability: Low Risk  (02/25/2024)   Housing Stability Vital Sign   . Unable to Pay for Housing in the Last Year: No   . Number of Times Moved in the Last Year: 0   . Homeless in the Last Year: No    FAMILY HISTORY   Family History  Problem Relation  Name Age of Onset  . Breast cancer Mother    . Sleep apnea Father    . Lung cancer Father    . Blindness Neg Hx    . Glaucoma Neg Hx    . Macular degeneration Neg Hx    . Diabetes Neg Hx    . High blood pressure (Hypertension) Neg Hx    . Anesthesia problems Neg Hx       MOLECULAR DIAGNOSTICS AND MARKERS   Biomarker or Mutation Result  MGMT unmethylated  IDH1 Wild type  IDH2   ATRX   NTRK1/2/3   Tumor mutational burden   BRAF   EGFR   EGFR vIII   H3F3A   HIST1H3B   TERT promoter   Ki67 50%    HISTORY OF PRESENT ILLNESS & REVIEW OF SYSTEMS     Fritz Cauthon returns to clinic s/p 6 cycles of 5-day Temozolomide ; last dosed 8/25-8/29/25.  History of Present Illness Headaches have become more frequent but are manageable with Tylenol. He experiences a subtle change in balance but has not experienced any falls or near falls. Recently, he developed pain behind his right eye, which he thinks might be due to straining during workouts. He regularly engages in weightlifting exercises.  He takes lacosamide  50 mg twice a day for seizure control, with his last seizure occurring six to seven months ago, described as a focal seizure with tingling in his hand.  He has been on an alternative medicine protocol including ivermectin, mebendazole, and metformin, which he completed. His labs have been stable and normal during this time.    CURRENT MEDICATIONS   Current Outpatient Medications  Medication Sig Dispense Refill  . acetaminophen (TYLENOL) 325 MG tablet Take 3 tablets (975 mg total) by mouth every 6 (six) hours as needed for Pain 180 tablet 0  . diclofenac (VOLTAREN) 1 % topical gel Apply 2 g topically 4 (four) times daily as needed    . diclofenac (VOLTAREN) 50 MG EC tablet Take 1 tablet (50 mg total) by mouth 3 (three) times daily as needed for pain not relieved by Tylenol. Take with food or snack 30 tablet 0  . HYDROmorphone  (DILAUDID ) 2 MG tablet Take 1 tablet (2 mg total) by  mouth every 6 (six) hours as needed for pain not relieved by Tylenol 16 tablet 0  . lacoSAMide  (VIMPAT ) 50 mg tablet Take 50 mg by mouth every 12 (twelve) hours    . ondansetron  (ZOFRAN ) 8 MG tablet Take 8 mg by mouth as needed    . polyethylene glycol (MIRALAX) powder Take 1 capful (17 g) by mouth once daily. Mix in 4-8  ounces of fluid prior to taking. 510 g 0  . sennosides-docusate (SENOKOT-S) 8.6-50 mg tablet Take 2 tablets by mouth 2 (two) times daily 80 tablet 0   No current facility-administered medications for this visit.    ALLERGIES   Not on File  PHYSICAL EXAMINATION   Physical Exam:  BP 115/72 (BP Location: Right upper arm, Patient Position: Sitting, BP Cuff Size: Large Adult)   Pulse 51   Temp 36.5 C (97.7 F) (Oral)   Resp 18   Ht 185 cm (6' 0.84)   Wt 92 kg (202 lb 13.2 oz)   SpO2 98%   BMI 26.88 kg/m  Body mass index is 26.88 kg/m. Body surface area is 2.17 meters squared. Wt Readings from Last 3 Encounters:  09/02/24 92 kg (202 lb 13.2 oz)  04/29/24 90.3 kg (199 lb 1.2 oz)  03/02/24 91.3 kg (201 lb 4.5 oz)    KPS: 90  Able to carry on normal activity   Distress Score:  (Patient-Rptd) (P) 5 General: Appears well, no distress HEENT: conjunctivae clear, sclerae anicteric, mucous membranes moist and oropharynx clear Head: Atraumatic                      Cardiovascular: regular rhythm, normal S1 and S2 and no audible murmur Lungs / Chest: lungs clear to auscultation bilaterally, no rales, rhonchi, or wheezes, normal respiratory effort. No cough. Gastrointestinal: normal active bowel sounds, soft, non-tender Extremities: no cyanosis, no edema Skin: warm and dry. no rash, normal skin turgor, normal texture and pigmentation Musculoskeletal: please see neurologic exam   Neurologic Exam:  Mental Status:   Normal affect. Attention span is intact.  Oriented to person, place, time, situation. Recall 3 of 3 words.   Language:  No word-finding difficulty. No  dysarthria. There is no evidence of receptive aphasia.  Cranial Nerves:   Olfactory (I): deferred Vision (II): Visual acuity is grossly normal.  Visual fields are full.  EOMs (III,IV,VI): Gaze is normal.  Tracking with smooth pursuits; no jerky saccades. No nystagmus.  Pupils(III): Pupils are equal, round, and reactive to light.  Normal accommodation.  Face (V, VII): Facial sensation is intact.  Muscles of mastication are normal.  Hearing (VIII): Hearing is intact to conversation. Gag/Swallow (IX): Gag reflex testing deferred.  Voice without hoarseness.  Palate (X): Elevates symmetrically.  Neck Strength (XI): SCM strength 5/5 bilaterally. Trapezius strength 5/5 bilaterally. Tongue (XII):  Midline without fasciculations. Motor:  Extremity strength 5/5 throughout.  No pronator drift.  0 - No muscle contraction 1 - Flicker of contraction visible 2 - Active movement with gravity eliminated 3 - Active movement against gravity 4 - Active movement against gravity and some resistance 5 - Normal power   Sensory:  Light touch sensation is grossly intact and symmetric throughout. Coordination: Finger to nose intact without dysmetria.  Romberg:  Negative Gait:  Normal walking gait.  Normal tandem walking.   LABS/STUDIES   Laboratory: None required today.  Imaging: EXAM:  MRI HEAD WITHOUT AND WITH CONTRAST   TECHNIQUE:  Multiplanar, multiecho pulse sequences of the brain and surrounding  structures were obtained without and with intravenous contrast.   CONTRAST:  9mL GADAVIST  GADOBUTROL  1 MMOL/ML IV SOLN   COMPARISON:  Brain MRI 06/28/2024 and earlier.   FINDINGS:  Brain: Progressive confluent and spiculated periventricular  enhancement along the posterior right temporal and anterior right  occipital horns, now encompassing an area of 37 x 26 mm on axial  images (AP by  transverse, previously roughly 22 x 16 mm at the same  level), on coronal images 21 x 33 mm (cc by transverse)  versus 12 x  24 mm at the same level in July.   Corresponding progressive regional T2/FLAIR hyperintensity (series  9, image 27) and mild new mass effect on the underlying ventricle  there. DWI in the area minimally more heterogeneous (series 5, image  28), and appears restricted there.   Overlying craniotomy. Mild regional hemosiderin is stable. No  superimposed dural thickening or enhancement. No ependymal or remote  enhancement identified.   No superimposed restricted diffusion suggestive of acute infarction.  No midline shift, ventriculomegaly, extra-axial collection or acute  intracranial hemorrhage. Cervicomedullary junction and pituitary are  within normal limits.   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 More normal for age  signal in the deep gray nuclei. Scattered nonspecific cerebral white  matter T2 and FLAIR hyperintensity in both hemispheres is stable.  Occasional other small vessel related chronic microhemorrhage in the  brain is stable (right external capsule).   Vascular: Major intracranial vascular flow voids are preserved.  Following contrast major dural venous sinuses are enhancing and  appear to be patent.   Skull and upper cervical spine: Previous craniotomy. Otherwise  negative. Negative visible cervical spine and spinal cord.   Sinuses/Orbits: Stable and negative.   Other: Mastoids are clear. Visible internal auditory structures  appear normal. Negative visible scalp and face.   IMPRESSION:  Since July progressive confluent and solid periventricular  enhancement at the confluence of the right temporal and occipital  horns, progressive regional T2/FLAIR, and mild new mass effect on  the underlying ventricle. This is most suspicious for true  progression of disease.    Electronically Signed    By: VEAR Hurst M.D.    On: 08/31/2024 07:59  BTC MRI Interpretation: MRIs were reviewed by Dr. Vicci who agrees with above  impression. REFERRING/CO-MANAGING PHYSICIANS  Patient Care Team: Self as PCP - General Vaslow, Zachary Koehn, MD (Oncology)  I spent a total of 40 minutes in both face-to-face and non-face-to-face activities for this visit on the date of this encounter.  ELIZABETH NORVELL SAR, PA     Attestation Statement:   I personally saw the patient and performed a substantive portion of this encounter, including a complete performance of at least one of the key components (MDM, Hx and/or Exam), in conjunction with the Advanced Practice Provider for high grade glioma, first recurrence. I performed the majority of decision making with regard to treatment and supportive care recommendations as above. He was screened for d2c7 + surgery and MOAB today. Off trial, single agent CCNU was recommended.   MARGARET EZELLA VICCI, MD [facility]

## 2024-09-01 NOTE — Progress Notes (Signed)
 Requested that images from MRI brain on 06/28/24 be pushed to [facility] through [employer].

## 2024-09-02 DIAGNOSIS — D496 Neoplasm of unspecified behavior of brain: Secondary | ICD-10-CM | POA: Diagnosis not present

## 2024-09-07 DIAGNOSIS — D496 Neoplasm of unspecified behavior of brain: Secondary | ICD-10-CM | POA: Diagnosis not present

## 2024-09-07 DIAGNOSIS — Z85820 Personal history of malignant melanoma of skin: Secondary | ICD-10-CM | POA: Diagnosis not present

## 2024-09-07 DIAGNOSIS — Z923 Personal history of irradiation: Secondary | ICD-10-CM | POA: Diagnosis not present

## 2024-09-07 DIAGNOSIS — Z85038 Personal history of other malignant neoplasm of large intestine: Secondary | ICD-10-CM | POA: Diagnosis not present

## 2024-09-07 DIAGNOSIS — Z79899 Other long term (current) drug therapy: Secondary | ICD-10-CM | POA: Diagnosis not present

## 2024-09-07 DIAGNOSIS — Z9221 Personal history of antineoplastic chemotherapy: Secondary | ICD-10-CM | POA: Diagnosis not present

## 2024-09-07 DIAGNOSIS — Z9049 Acquired absence of other specified parts of digestive tract: Secondary | ICD-10-CM | POA: Diagnosis not present

## 2024-09-07 DIAGNOSIS — Z01818 Encounter for other preprocedural examination: Secondary | ICD-10-CM | POA: Diagnosis not present

## 2024-09-07 DIAGNOSIS — C712 Malignant neoplasm of temporal lobe: Secondary | ICD-10-CM | POA: Diagnosis not present

## 2024-09-07 NOTE — Unmapped External Note (Signed)
 Medication instructions prior to procedure:    Medication Sig INSTRUCTIONS  . acetaminophen (TYLENOL) 325 MG tablet Take 3 tablets (975 mg total) by mouth every 6 (six) hours as needed for Pain TAKE day of procedure, if needed  . diclofenac (VOLTAREN) 1 % topical gel Apply 2 g topically 4 (four) times daily as needed DO NOT TAKE day of procedure  . diclofenac (VOLTAREN) 50 MG EC tablet Take 1 tablet (50 mg total) by mouth 3 (three) times daily as needed for pain not relieved by Tylenol. Take with food or snack DO NOT TAKE day of procedure  . HYDROmorphone  (DILAUDID ) 2 MG tablet Take 1 tablet (2 mg total) by mouth every 6 (six) hours as needed for pain not relieved by Tylenol DO NOT TAKE day of procedure  . lacoSAMide  (VIMPAT ) 50 mg tablet Take 1 tablet (50 mg total) by mouth every 12 (twelve) hours for 7 days TAKE day of procedure  . ondansetron  (ZOFRAN ) 8 MG tablet Take 8 mg by mouth as needed TAKE day of procedure, if needed  . polyethylene glycol (MIRALAX) powder Take 1 capful (17 g) by mouth once daily. Mix in 4-8 ounces of fluid prior to taking. DO NOT TAKE day of procedure  . traZODone (DESYREL) 50 MG tablet Take 50 mg by mouth at bedtime TAKE night before procedure if needed.

## 2024-09-09 ENCOUNTER — Other Ambulatory Visit: Payer: Self-pay

## 2024-09-12 ENCOUNTER — Other Ambulatory Visit: Payer: Self-pay

## 2024-09-17 ENCOUNTER — Other Ambulatory Visit: Payer: Self-pay

## 2024-09-27 ENCOUNTER — Other Ambulatory Visit: Payer: Self-pay | Admitting: Internal Medicine

## 2024-09-29 ENCOUNTER — Other Ambulatory Visit: Payer: Self-pay

## 2024-10-01 ENCOUNTER — Other Ambulatory Visit: Payer: Self-pay

## 2024-10-12 ENCOUNTER — Telehealth: Payer: Self-pay

## 2024-10-12 DIAGNOSIS — C714 Malignant neoplasm of occipital lobe: Secondary | ICD-10-CM

## 2024-10-12 NOTE — Telephone Encounter (Signed)
 T/C from [place] at [facility] asking if we can order an MRI on Mr Blyden so that it can be done locally and pt will not have to drive so far.   His next appt at [facility] is 11/11/24 and she is requesting it be done before his next appt

## 2024-10-12 NOTE — Addendum Note (Signed)
 Addended by: Nadelyn Enriques K on: 10/12/2024 03:50 PM   Modules accepted: Orders

## 2024-10-13 ENCOUNTER — Other Ambulatory Visit: Payer: Self-pay

## 2024-10-14 ENCOUNTER — Other Ambulatory Visit: Payer: Self-pay

## 2024-11-03 ENCOUNTER — Encounter: Payer: Self-pay | Admitting: Internal Medicine

## 2024-11-03 ENCOUNTER — Other Ambulatory Visit: Payer: Self-pay | Admitting: Internal Medicine

## 2024-11-04 ENCOUNTER — Ambulatory Visit (HOSPITAL_COMMUNITY)
Admission: RE | Admit: 2024-11-04 | Discharge: 2024-11-04 | Disposition: A | Source: Ambulatory Visit | Attending: Internal Medicine

## 2024-11-04 ENCOUNTER — Encounter: Payer: Self-pay | Admitting: Internal Medicine

## 2024-11-04 DIAGNOSIS — C714 Malignant neoplasm of occipital lobe: Secondary | ICD-10-CM | POA: Insufficient documentation

## 2024-11-04 MED ORDER — GADOBUTROL 1 MMOL/ML IV SOLN
9.0000 mL | Freq: Once | INTRAVENOUS | Status: AC | PRN
  Administered 2024-11-04: 9 mL via INTRAVENOUS

## 2024-11-09 ENCOUNTER — Encounter: Payer: Self-pay | Admitting: *Deleted

## 2024-11-09 NOTE — Progress Notes (Signed)
 Requested that images from MRI brain on 11/04/24 be pushed to [facility] through [employer].  Email to [employer].

## 2024-11-12 ENCOUNTER — Encounter: Payer: Self-pay | Admitting: Internal Medicine

## 2024-11-12 ENCOUNTER — Telehealth: Payer: Self-pay

## 2024-11-12 ENCOUNTER — Other Ambulatory Visit: Payer: Self-pay | Admitting: Internal Medicine

## 2024-11-12 DIAGNOSIS — C714 Malignant neoplasm of occipital lobe: Secondary | ICD-10-CM

## 2024-11-12 NOTE — Progress Notes (Signed)
 DISCONTINUE ON PATHWAY REGIMEN - Neuro     One cycle, concurrent with RT:     Temozolomide    **Always confirm dose/schedule in your pharmacy ordering system**  PRIOR TREATMENT: [id number]: Radiation Therapy with Concurrent Temozolomide  75 mg/m2 Daily x 6 Weeks, Followed by Adjuvant Temozolomide   START ON PATHWAY REGIMEN - Neuro     A cycle is every 21 days:     Bevacizumab-xxxx   **Always confirm dose/schedule in your pharmacy ordering system**  Patient Characteristics: Glioma, Glioblastoma, IDH-wildtype, Recurrent or Progressive, Adjuvant Therapy, Systemic Therapy Candidate, BRAF V600E Mutation Negative/Unknown and NTRK Fusion Negative/Unknown Disease Classification: Glioma Disease Classification: Glioblastoma, IDH-wildtype Disease Status: Recurrent or Progressive Treatment Classification: Adjuvant Therapy Treatment (Nonsurgical/Adjuvant): Systemic Therapy Candidate BRAF V600E Mutation Status: Negative NTRK Gene Fusion Status: Negative Intent of Therapy: Non-Curative / Palliative Intent, Discussed with Patient

## 2024-11-12 NOTE — Telephone Encounter (Signed)
 Returned call to pt's wife , Trevor Contreras.  Trevor Contreras stated [facility] has instructed them to contact Dr. Buckley regarding getting an Avastin infusion scheduled.  She reported [place] will need to apply for pre authorization of infusion. Advised Trevor Contreras that scheduling will contact them when order has been placed and pre authorization has occurred.

## 2024-11-15 ENCOUNTER — Telehealth: Payer: Self-pay

## 2024-11-15 NOTE — Telephone Encounter (Signed)
 S/w patient's wife, Trevor Contreras, to confirm upcoming appointments for tomorrow and to confirm that avastin  had been authorized by [employer]. Patient and wife verbalized an understanding of the information and voiced appreciation for the call.

## 2024-11-16 ENCOUNTER — Inpatient Hospital Stay

## 2024-11-16 ENCOUNTER — Other Ambulatory Visit: Payer: Self-pay

## 2024-11-16 ENCOUNTER — Inpatient Hospital Stay: Attending: Internal Medicine

## 2024-11-16 ENCOUNTER — Inpatient Hospital Stay: Admitting: Internal Medicine

## 2024-11-16 VITALS — BP 127/82 | HR 69 | Temp 98.1°F | Resp 17 | Ht 73.0 in | Wt 200.0 lb

## 2024-11-16 DIAGNOSIS — Z5112 Encounter for antineoplastic immunotherapy: Secondary | ICD-10-CM | POA: Insufficient documentation

## 2024-11-16 DIAGNOSIS — Z9221 Personal history of antineoplastic chemotherapy: Secondary | ICD-10-CM | POA: Diagnosis not present

## 2024-11-16 DIAGNOSIS — Z801 Family history of malignant neoplasm of trachea, bronchus and lung: Secondary | ICD-10-CM | POA: Diagnosis not present

## 2024-11-16 DIAGNOSIS — Z923 Personal history of irradiation: Secondary | ICD-10-CM | POA: Insufficient documentation

## 2024-11-16 DIAGNOSIS — C714 Malignant neoplasm of occipital lobe: Secondary | ICD-10-CM

## 2024-11-16 DIAGNOSIS — Z85038 Personal history of other malignant neoplasm of large intestine: Secondary | ICD-10-CM | POA: Insufficient documentation

## 2024-11-16 LAB — CBC WITH DIFFERENTIAL (CANCER CENTER ONLY)
Abs Immature Granulocytes: 0.02 K/uL (ref 0.00–0.07)
Basophils Absolute: 0.1 K/uL (ref 0.0–0.1)
Basophils Relative: 2 %
Eosinophils Absolute: 0.1 K/uL (ref 0.0–0.5)
Eosinophils Relative: 2 %
HCT: 36.1 % — ABNORMAL LOW (ref 39.0–52.0)
Hemoglobin: 12.1 g/dL — ABNORMAL LOW (ref 13.0–17.0)
Immature Granulocytes: 0 %
Lymphocytes Relative: 28 %
Lymphs Abs: 1.4 K/uL (ref 0.7–4.0)
MCH: 28.9 pg (ref 26.0–34.0)
MCHC: 33.5 g/dL (ref 30.0–36.0)
MCV: 86.4 fL (ref 80.0–100.0)
Monocytes Absolute: 0.6 K/uL (ref 0.1–1.0)
Monocytes Relative: 11 %
Neutro Abs: 2.9 K/uL (ref 1.7–7.7)
Neutrophils Relative %: 57 %
Platelet Count: 255 K/uL (ref 150–400)
RBC: 4.18 MIL/uL — ABNORMAL LOW (ref 4.22–5.81)
RDW: 14.6 % (ref 11.5–15.5)
WBC Count: 5.1 K/uL (ref 4.0–10.5)
nRBC: 0 % (ref 0.0–0.2)

## 2024-11-16 LAB — TOTAL PROTEIN, URINE DIPSTICK: Protein, ur: NEGATIVE mg/dL

## 2024-11-16 MED ORDER — SODIUM CHLORIDE 0.9 % IV SOLN: INTRAVENOUS | Status: DC

## 2024-11-16 MED ORDER — SODIUM CHLORIDE 0.9 % IV SOLN
7.5000 mg/kg | Freq: Once | INTRAVENOUS | Status: AC
  Administered 2024-11-16: 16:00:00 700 mg via INTRAVENOUS
  Filled 2024-11-16: qty 16

## 2024-11-16 NOTE — Progress Notes (Signed)
 [facility] at [facility] [address]  [place], [place] [address] [phone]   Interval Evaluation  Date of Service: 11/16/2024 Patient Name: Trevor Contreras Patient MRN: [id number] Patient DOB: Feb 11, 1956 Provider: Arthea MARLA Manns, MD  Identifying Statement:  Trevor Contreras is a 68 y.o. male with right occipital glioblastoma    Oncologic History: Oncology History  Glioblastoma multiforme of occipital lobe (HCC)  11/27/2023 Surgery   Craniotomy, resection of right occipital mass with Dr. Saturnino; path is glioblastoma, IDHwt   12/29/2023 - 02/06/2024 Radiation Therapy   IMRT with concurrent Temodar  75mg /m2 Valene)   03/08/2024 -  Chemotherapy   Initiates adjuvant 5-day Temodar  150-200mg /m2   08/31/2024 Progression   Progression of Disease #1   11/16/2024 -  Chemotherapy   Patient is on Treatment Plan : BRAIN ANAPLASTIC GLIOMA, GLIOBLASTOMA Bevacizumab  q21d       Biomarkers:  MGMT Unmethylated.  IDH 1/2 Wild type.  EGFR Unknown  TERT Unknown   Interval History: Trevor Contreras presents today for follow up, initiation of avastin  per [facility] team recommendation.  He continues to tolerate the study drugs well overall, although the first peri-lymphatic injection led to significant fatigue, self limited.  Most recent injection 11/11/24 was better tolerated.  Doing well otherwise with the Vimpat  100mg  BID.  Denies severe headaches, seizures.    H+P (12/16/23) Patient presented to neurologic attention on 11/09/23 with new onset seizures, characterized as flashing lights in the left peripheral vision. CNS imaging demonstrated enhancing mass in the right occipital lobe, c/w primary tumor.  He underwent craniotomy, resection with Dr. Saturnino at [facility] on 11/27/23.  Following surgery, he has had few issues, mostly normal vision.  Is independent with activities of daily living, no further seizures.  Medications: Current Outpatient Medications on File Prior to Visit   Medication Sig Dispense Refill   acetaminophen (TYLENOL) 325 MG tablet Take 650 mg by mouth every 4 (four) hours as needed.     Lacosamide  100 MG TABS TAKE 1 TABLET (100 MG TOTAL) BY MOUTH IN THE MORNING AND AT BEDTIME. 60 tablet 2   lidocaine -prilocaine  (EMLA ) cream APPLY TOPICALLY 1 APPLICATION AS NEEDED 30 g 0   No current facility-administered medications on file prior to visit.    Allergies: No Known Allergies Past Medical History:  Past Medical History:  Diagnosis Date   Colon cancer (HCC) 2002   Past Surgical History:  Past Surgical History:  Procedure Laterality Date   COLON SURGERY     COLONOSCOPY     EYE SURGERY     trying to correct blurred vision in left eye; unsuccessful   KNEE ARTHROSCOPY     bilat; several surgeries   SHOULDER ARTHROSCOPY     rotator cuff, reattach part of long bicep, Dr Levorn   Social History:  Social History   Socioeconomic History   Marital status: Married    Spouse name: Not on file   Number of children: Not on file   Years of education: Not on file   Highest education level: Bachelor's degree (e.g., BA, AB, BS)  Occupational History   Not on file  Tobacco Use   Smoking status: Never   Smokeless tobacco: Never  Vaping Use   Vaping status: Never Used  Substance and Sexual Activity   Alcohol use: Yes    Comment: 2-3 times weekly, beer   Drug use: No   Sexual activity: Not on file  Other Topics Concern   Not on file  Social History  Narrative   Not on file   Social Drivers of Health   Tobacco Use: Medium Risk (10/21/2024)   Received from [facility]   Patient History    Smoking Tobacco Use: Passive Smoke Exposure - Never Smoker    Smokeless Tobacco Use: Never    Passive Exposure: Yes  Financial Resource Strain: Low Risk  (10/09/2024)   Received from [facility]   Overall Financial Resource Strain (CARDIA)    Difficulty of Paying Living Expenses: Not hard at all  Food Insecurity: No  Food Insecurity (10/09/2024)   Received from [facility]   Epic    Within the past 12 months, you worried that your food would run out before you got the money to buy more.: Never true    Within the past 12 months, the food you bought just didn't last and you didn't have money to get more.: Never true  Transportation Needs: No Transportation Needs (10/09/2024)   Received from [facility] - Transportation    In the past 12 months, has lack of transportation kept you from medical appointments or from getting medications?: No    Lack of Transportation (Non-Medical): No  Physical Activity: Sufficiently Active (07/22/2024)   Exercise Vital Sign    Days of Exercise per Week: 6 days    Minutes of Exercise per Session: 80 min  Stress: Stress Concern Present (07/22/2024)   Trevor Contreras [employer] - Occupational Stress Questionnaire    Feeling of Stress: To some extent  Social Connections: Moderately Integrated (07/22/2024)   Social Connection and Isolation Panel    Frequency of Communication with Friends and Family: More than three times a week    Frequency of Social Gatherings with Friends and Family: Once a week    Attends Religious Services: 1 to 4 times per year    Active Member of [employer] or Organizations: No    Attends Engineer, Structural: Not on file    Marital Status: Married  Catering Manager Violence: Not At Risk (11/17/2023)   Received from [facility]   HITS    Over the last 12 months how often did your partner physically hurt you?: Never    Over the last 12 months how often did your partner insult you or talk down to you?: Never    Over the last 12 months how often did your partner threaten you with physical harm?: Never    Over the last 12 months how often did your partner scream or curse at you?: Never  Depression (PHQ2-9): Low Risk (11/16/2024)   Depression (PHQ2-9)    PHQ-2 Score: 0  Alcohol Screen: Low  Risk (07/22/2024)   Alcohol Screen    Last Alcohol Screening Score (AUDIT): 3  Housing: Low Risk  (10/09/2024)   Received from [facility]   Epic    In the last 12 months, was there a time when you were not able to pay the mortgage or rent on time?: No    In the past 12 months, how many times have you moved where you were living?: 0    At any time in the past 12 months, were you homeless or living in a shelter (including now)?: No  Utilities: Not At Risk (10/09/2024)   Received from [facility]   Epic    In the past 12 months has the electric, gas, oil, or water company threatened to shut off  services in your home?: No  Health Literacy: Not on file   Family History:  Family History  Problem Relation Age of Onset   Cancer Mother        breast   Cancer Father        lung   Colon cancer Neg Hx    Colon polyps Neg Hx    Esophageal cancer Neg Hx    Stomach cancer Neg Hx    Rectal cancer Neg Hx     Review of Systems: Constitutional: Doesn't report fevers, chills or abnormal weight loss Eyes: Doesn't report blurriness of vision Ears, nose, mouth, throat, and face: Doesn't report sore throat Respiratory: Doesn't report cough, dyspnea or wheezes Cardiovascular: Doesn't report palpitation, chest discomfort  Gastrointestinal:  Doesn't report nausea, constipation, diarrhea GU: Doesn't report incontinence Skin: Doesn't report skin rashes Neurological: Per HPI Musculoskeletal: Doesn't report joint pain Behavioral/Psych: Doesn't report anxiety  Physical Exam: Vitals:   11/16/24 1452  BP: 127/82  Pulse: 69  Resp: 17  Temp: 98.1 F (36.7 C)  SpO2: 100%   KPS: 80. General: Alert, cooperative, pleasant, in no acute distress Head: Normal EENT: No conjunctival injection or scleral icterus.  Lungs: Resp effort normal Cardiac: Regular rate Abdomen: Non-distended abdomen Skin: No rashes cyanosis or petechiae. Extremities: No clubbing or  edema  Neurologic Exam: Mental Status: Awake, alert, attentive to examiner. Oriented to self and environment. Language is fluent with intact comprehension.  Cranial Nerves: Visual acuity is grossly normal. Visual fields are full. Extra-ocular movements intact. No ptosis. Face is symmetric Motor: Tone and bulk are normal. Power is full in both arms and legs. Reflexes are symmetric, no pathologic reflexes present.  Sensory: Intact to light touch Gait: Normal.   Labs: I have reviewed the data as listed    Component Value Date/Time   NA 139 08/31/2024 1003   NA 143 07/26/2024 0832   K 3.9 08/31/2024 1003   CL 107 08/31/2024 1003   CO2 27 08/31/2024 1003   GLUCOSE 90 08/31/2024 1003   BUN 16 08/31/2024 1003   BUN 17 07/26/2024 0832   CREATININE 0.91 08/31/2024 1003   CREATININE 0.91 05/31/2013 1310   CALCIUM  9.2 08/31/2024 1003   PROT 6.5 08/31/2024 1003   PROT 6.5 07/26/2024 0832   ALBUMIN 4.0 08/31/2024 1003   ALBUMIN 4.4 07/26/2024 0832   AST 15 08/31/2024 1003   ALT 12 08/31/2024 1003   ALKPHOS 76 08/31/2024 1003   BILITOT 0.6 08/31/2024 1003   GFRNONAA >60 08/31/2024 1003   GFRNONAA >89 05/31/2013 1310   GFRAA 88 06/02/2020 1444   GFRAA >89 05/31/2013 1310   Lab Results  Component Value Date   WBC 5.1 11/16/2024   NEUTROABS 2.9 11/16/2024   HGB 12.1 (L) 11/16/2024   HCT 36.1 (L) 11/16/2024   MCV 86.4 11/16/2024   PLT 255 11/16/2024    Imaging:  [facility] Clinician Interpretation: I have personally reviewed the CNS images as listed.  My interpretation, in the context of the patient's clinical presentation, is likely treatment effect  MR BRAIN W WO CONTRAST Result Date: 11/04/2024 EXAM: MRI BRAIN WITH AND WITHOUT CONTRAST 11/04/2024 11:32:56 AM TECHNIQUE: Multiplanar multisequence MRI of the head/brain was performed with and without the administration of intravenous contrast. 9 mL of gadavist  COMPARISON: Brain MRI 08/30/2024 and earlier. CLINICAL HISTORY: 68 year old  male with right occipital glioblastoma, status post resection in October, and intratumoral catheter placement for immunotherapy. Restaging. FINDINGS: BRAIN AND VENTRICLES: No acute intracranial hemorrhage. The sella is  unremarkable. Normal flow voids. Stable major vascular flow voids. Following contrast, the major dural venous sinuses are enhancing and appear patent. Interval re resection and postoperative changes to the right hemisphere tumor epicenter. New stellate resection cavity at the junction of the posterior right temporal and anterior occipital lobes corresponding to the epicenter of abnormal enhancement in September (series 16 image 67). Nodular and irregular enhancement is present along the margins of the resection cavity, continuing to and inseparable from the ependymoid of the right lateral ventricle. There is decreased right occipital horn and atrium right lateral ventricle size compared to September, which appears due to new mass effect. Nodular periventricular enhancement there (series 16 image 85) appears progressed from faint nodular enhancement in September. Overlying post craniotomy changes. A smoother rim enhancing fluid collection occupies the posterior right middle temporal gyrus (series 17 image 15) with somewhat thick marginal diffusion restriction (series 5 image 25), but facilitated diffusion centrally. Only mild hemosiderin is scattered in these regions. Regional T2 and FLAIR hyperintensity has progressed from the right temporal and occipital lobes (series 9 image 24) tracking further along the posterior right lateral ventricle (series 9 image 33) with increased mass effect on the ventricle there. No ventriculomegaly or trapping elsewhere. No superimposed abnormal diffusion suspicious for infarct. However, there is a small indeterminate area of abnormal diffusion in the occipital horn of the left lateral ventricle on series 5 image 21, not associated with enhancement or obvious  ventricular debris. No new ependymal enhancement elsewhere. Normal basilar cisterns. Chronic small cerebellar infarcts. Left hemisphere gray and white matter signal is stable. Posterior fossa gray and white matter signal is stable. Stable and negative visible cervical spine and spinal cord. ORBITS: No acute abnormality. SINUSES: Stable paranasal sinus and mastoid aeration. BONES AND SOFT TISSUES: Normal bone marrow signal and enhancement. No acute soft tissue abnormality. IMPRESSION: 1. Interval re-resection and postoperative changes to the right hemisphere tumor epicenter. 2. Persistent abnormal enhancement, and especially progressive confluent and mass-like T2/FLAIR hyperintensity since 08/30/2024. New mass effect on the right lateral ventricle atrium and occipital horn. The constellation strongly suggests tumor progression. 3. No significant midline shift or other new complicating features. Electronically signed by: Helayne Hurst MD 11/04/2024 12:23 PM EST RP Workstation: [id number]    Assessment/Plan Glioblastoma multiforme of occipital lobe (HCC)  Azarian Starace is clinically stable today, now on study at [facility].  MRI brain demonstrated worsening inflammatory and cystic change following intratumoral infusion of D2C7.  [facility] team recommended initiating avastin , which we will plan to administer starting today.  We did discuss and recommend proceeding with avastin  7.5mg /kg IV q3 weeks.  Avastin  will be helpful in treatment of inflammation, as steroid sparing agent, and possibly as disease modifying drug.  We reviewed potential side effects, including hypertension, bleeding/clotting events, and wound healing impairment.  The patient will have a complete blood count, a comprehensive metabolic panel, and urine protein performed prior to each avastin  infusion. Labs may need to be performed more often.   Avastin  should be held for the following:  ANC less than 500  Platelets less than 50,000  LFT or creatinine  greater than 2x ULN  If clinical concerns/contraindications develop  Agreeable with continuing Vimpat  100mg  BID.  We ask that Warden Buffa return to clinic in 3 weeks for cycle #2 avastin , or sooner if needed.  All questions were answered. The patient knows to call the clinic with any problems, questions or concerns. No barriers to learning were detected.  The total time spent in  the encounter was 40 minutes and more than 50% was on counseling and review of test results   Arthea MARLA Manns, MD Medical Director of Neuro-Oncology [facility] at [facility] 11/16/2024 3:12 PM

## 2024-11-16 NOTE — Patient Instructions (Signed)
 [facility] - A DEPT OF [facility]  Discharge Instructions: Thank you for choosing [facility] to provide your oncology and hematology care.   If you have a lab appointment with the Cancer Center, please go directly to the Cancer Center and check in at the registration area.   Wear comfortable clothing and clothing appropriate for easy access to any Portacath or PICC line.   We strive to give you quality time with your provider. You may need to reschedule your appointment if you arrive late (15 or more minutes).  Arriving late affects you and other patients whose appointments are after yours.  Also, if you miss three or more appointments without notifying the office, you may be dismissed from the clinic at the provider's discretion.      For prescription refill requests, have your pharmacy contact our office and allow 72 hours for refills to be completed.    Today you received the following chemotherapy and/or immunotherapy agents: MVASI     To help prevent nausea and vomiting after your treatment, we encourage you to take your nausea medication as directed.  BELOW ARE SYMPTOMS THAT SHOULD BE REPORTED IMMEDIATELY: *FEVER GREATER THAN 100.4 F (38 C) OR HIGHER *CHILLS OR SWEATING *NAUSEA AND VOMITING THAT IS NOT CONTROLLED WITH YOUR NAUSEA MEDICATION *UNUSUAL SHORTNESS OF BREATH *UNUSUAL BRUISING OR BLEEDING *URINARY PROBLEMS (pain or burning when urinating, or frequent urination) *BOWEL PROBLEMS (unusual diarrhea, constipation, pain near the anus) TENDERNESS IN MOUTH AND THROAT WITH OR WITHOUT PRESENCE OF ULCERS (sore throat, sores in mouth, or a toothache) UNUSUAL RASH, SWELLING OR PAIN  UNUSUAL VAGINAL DISCHARGE OR ITCHING   Items with * indicate a potential emergency and should be followed up as soon as possible or go to the Emergency Department if any problems should occur.  Please show the CHEMOTHERAPY ALERT CARD or IMMUNOTHERAPY ALERT  CARD at check-in to the Emergency Department and triage nurse.  Should you have questions after your visit or need to cancel or reschedule your appointment, please contact [facility] - A DEPT OF [facility]  Dept: [phone]  and follow the prompts.  Office hours are 8:00 a.m. to 4:30 p.m. Monday - Friday. Please note that voicemails left after 4:00 p.m. may not be returned until the following business day.  We are closed weekends and major holidays. You have access to a nurse at all times for urgent questions. Please call the main number to the clinic Dept: [phone] and follow the prompts.   For any non-urgent questions, you may also contact your provider using MyChart. We now offer e-Visits for anyone 109 and older to request care online for non-urgent symptoms. For details visit [email or web address].   Also download the MyChart app! Go to the app store, search "MyChart", open the app, select [place], and log in with your MyChart username and password.

## 2024-11-16 NOTE — Progress Notes (Signed)
 Pharmacist Chemotherapy Monitoring - Initial Assessment    Anticipated start date: 11/16/24   The following has been reviewed per standard work regarding the patient's treatment regimen: The patient's diagnosis, treatment plan and drug doses, and organ/hematologic function Lab orders and baseline tests specific to treatment regimen  The treatment plan start date, drug sequencing, and pre-medications Prior authorization status  Patient's documented medication list, including drug-drug interaction screen and prescriptions for anti-emetics and supportive care specific to the treatment regimen The drug concentrations, fluid compatibility, administration routes, and timing of the medications to be used The patient's access for treatment and lifetime cumulative dose history, if applicable  The patient's medication allergies and previous infusion related reactions, if applicable   Changes made to treatment plan:  Bevacizumab  infusion time changed to 15 min for 7.5 mg/kg dose  Follow up needed:  Need UA with each cycle per [facility] recommendations? Will check with MD after 12/16 office visit  Harlene JONELLE Nasuti, [facility], 11/16/2024  2:04 PM  ADDENDUM: Urine protein with each cycle and associated treatment conditions entered per Dr. Federico okay.  Harlene Nasuti, PharmD Oncology Infusion Pharmacist 11/17/2024 10:11 AM

## 2024-11-17 ENCOUNTER — Ambulatory Visit: Payer: Self-pay | Admitting: *Deleted

## 2024-11-17 ENCOUNTER — Other Ambulatory Visit: Payer: Self-pay

## 2024-11-19 ENCOUNTER — Other Ambulatory Visit: Payer: Self-pay

## 2024-11-21 ENCOUNTER — Other Ambulatory Visit: Payer: Self-pay | Admitting: Internal Medicine

## 2024-11-22 NOTE — Telephone Encounter (Signed)
 Pt is not taking

## 2024-12-03 ENCOUNTER — Other Ambulatory Visit: Payer: Self-pay

## 2024-12-03 ENCOUNTER — Ambulatory Visit (HOSPITAL_COMMUNITY)
Admission: RE | Admit: 2024-12-03 | Discharge: 2024-12-03 | Disposition: A | Discharge status: Home / Self Care | Source: Ambulatory Visit | Attending: Internal Medicine | Admitting: Internal Medicine

## 2024-12-03 DIAGNOSIS — C714 Malignant neoplasm of occipital lobe: Secondary | ICD-10-CM

## 2024-12-03 MED ORDER — IOHEXOL 350 MG/ML SOLN
75.0000 mL | Freq: Once | INTRAVENOUS | Status: AC | PRN
  Administered 2024-12-03: 75 mL via INTRAVENOUS

## 2024-12-07 ENCOUNTER — Inpatient Hospital Stay: Admitting: Internal Medicine

## 2024-12-07 ENCOUNTER — Inpatient Hospital Stay: Attending: Internal Medicine

## 2024-12-07 ENCOUNTER — Inpatient Hospital Stay

## 2024-12-07 VITALS — BP 126/77 | HR 57 | Temp 97.0°F | Resp 20 | Wt 202.6 lb

## 2024-12-07 VITALS — BP 119/73 | HR 57

## 2024-12-07 DIAGNOSIS — C714 Malignant neoplasm of occipital lobe: Secondary | ICD-10-CM | POA: Diagnosis not present

## 2024-12-07 DIAGNOSIS — Z5112 Encounter for antineoplastic immunotherapy: Secondary | ICD-10-CM | POA: Diagnosis present

## 2024-12-07 DIAGNOSIS — Z801 Family history of malignant neoplasm of trachea, bronchus and lung: Secondary | ICD-10-CM | POA: Insufficient documentation

## 2024-12-07 DIAGNOSIS — Z85038 Personal history of other malignant neoplasm of large intestine: Secondary | ICD-10-CM | POA: Diagnosis not present

## 2024-12-07 LAB — CBC WITH DIFFERENTIAL (CANCER CENTER ONLY)
Abs Immature Granulocytes: 0 K/uL (ref 0.00–0.07)
Basophils Absolute: 0.1 K/uL (ref 0.0–0.1)
Basophils Relative: 2 %
Eosinophils Absolute: 0.2 K/uL (ref 0.0–0.5)
Eosinophils Relative: 3 %
HCT: 41 % (ref 39.0–52.0)
Hemoglobin: 13.6 g/dL (ref 13.0–17.0)
Immature Granulocytes: 0 %
Lymphocytes Relative: 31 %
Lymphs Abs: 1.7 K/uL (ref 0.7–4.0)
MCH: 28.9 pg (ref 26.0–34.0)
MCHC: 33.2 g/dL (ref 30.0–36.0)
MCV: 87 fL (ref 80.0–100.0)
Monocytes Absolute: 0.6 K/uL (ref 0.1–1.0)
Monocytes Relative: 12 %
Neutro Abs: 2.9 K/uL (ref 1.7–7.7)
Neutrophils Relative %: 52 %
Platelet Count: 255 K/uL (ref 150–400)
RBC: 4.71 MIL/uL (ref 4.22–5.81)
RDW: 14.7 % (ref 11.5–15.5)
WBC Count: 5.6 K/uL (ref 4.0–10.5)
nRBC: 0 % (ref 0.0–0.2)

## 2024-12-07 LAB — TOTAL PROTEIN, URINE DIPSTICK: Protein, ur: NEGATIVE mg/dL

## 2024-12-07 MED ORDER — SODIUM CHLORIDE 0.9 % IV SOLN: INTRAVENOUS | Status: DC

## 2024-12-07 MED ORDER — SODIUM CHLORIDE 0.9 % IV SOLN
7.5000 mg/kg | Freq: Once | INTRAVENOUS | Status: AC
  Administered 2024-12-07: 700 mg via INTRAVENOUS
  Filled 2024-12-07: qty 12

## 2024-12-07 NOTE — Patient Instructions (Signed)
 [facility] - A DEPT OF [facility]  Discharge Instructions: Thank you for choosing [facility] to provide your oncology and hematology care.   If you have a lab appointment with the Cancer Center, please go directly to the Cancer Center and check in at the registration area.   Wear comfortable clothing and clothing appropriate for easy access to any Portacath or PICC line.   We strive to give you quality time with your provider. You may need to reschedule your appointment if you arrive late (15 or more minutes).  Arriving late affects you and other patients whose appointments are after yours.  Also, if you miss three or more appointments without notifying the office, you may be dismissed from the clinic at the providers discretion.      For prescription refill requests, have your pharmacy contact our office and allow 72 hours for refills to be completed.    Today you received the following chemotherapy and/or immunotherapy agents :  Bevicizumab      To help prevent nausea and vomiting after your treatment, we encourage you to take your nausea medication as directed.  BELOW ARE SYMPTOMS THAT SHOULD BE REPORTED IMMEDIATELY: *FEVER GREATER THAN 100.4 F (38 C) OR HIGHER *CHILLS OR SWEATING *NAUSEA AND VOMITING THAT IS NOT CONTROLLED WITH YOUR NAUSEA MEDICATION *UNUSUAL SHORTNESS OF BREATH *UNUSUAL BRUISING OR BLEEDING *URINARY PROBLEMS (pain or burning when urinating, or frequent urination) *BOWEL PROBLEMS (unusual diarrhea, constipation, pain near the anus) TENDERNESS IN MOUTH AND THROAT WITH OR WITHOUT PRESENCE OF ULCERS (sore throat, sores in mouth, or a toothache) UNUSUAL RASH, SWELLING OR PAIN  UNUSUAL VAGINAL DISCHARGE OR ITCHING   Items with * indicate a potential emergency and should be followed up as soon as possible or go to the Emergency Department if any problems should occur.  Please show the CHEMOTHERAPY ALERT CARD or  IMMUNOTHERAPY ALERT CARD at check-in to the Emergency Department and triage nurse.  Should you have questions after your visit or need to cancel or reschedule your appointment, please contact [facility] - A DEPT OF [facility]  Dept: [phone]  and follow the prompts.  Office hours are 8:00 a.m. to 4:30 p.m. Monday - Friday. Please note that voicemails left after 4:00 p.m. may not be returned until the following business day.  We are closed weekends and major holidays. You have access to a nurse at all times for urgent questions. Please call the main number to the clinic Dept: [phone] and follow the prompts.   For any non-urgent questions, you may also contact your provider using MyChart. We now offer e-Visits for anyone 5 and older to request care online for non-urgent symptoms. For details visit [email or web address].   Also download the MyChart app! Go to the app store, search MyChart, open the app, select [place], and log in with your MyChart username and password.

## 2024-12-07 NOTE — Progress Notes (Signed)
 "  [facility] at [facility] [address]  [place], [place] [address] [phone]   Interval Evaluation  Date of Service: 12/07/2024 Patient Name: Trevor Contreras Patient MRN: [id number] Patient DOB: [birth year] Provider: Arthea MARLA Manns, MD  Identifying Statement:  Raeshaun Simson is a 69 y.o. male with right occipital glioblastoma    Oncologic History: Oncology History  Glioblastoma multiforme of occipital lobe (HCC)  11/27/2023 Surgery   Craniotomy, resection of right occipital mass with Dr. Saturnino; path is glioblastoma, IDHwt   12/29/2023 - 02/06/2024 Radiation Therapy   IMRT with concurrent Temodar  75mg /m2 Valene)   03/08/2024 -  Chemotherapy   Initiates adjuvant 5-day Temodar  150-200mg /m2   08/31/2024 Progression   Progression of Disease #1   10/08/2024 Surgery   [facility] clinical trial, CED intratumoral infusion of D2C7 and anti-CD40 ab.   11/16/2024 -  Chemotherapy   Patient is on Treatment Plan : BRAIN ANAPLASTIC GLIOMA, GLIOBLASTOMA Bevacizumab  q21d       Biomarkers:  MGMT Unmethylated.  IDH 1/2 Wild type.  EGFR Unknown  TERT Unknown   Interval History: Avonte Sensabaugh presents today for follow up, cycle #2 avastin .  He did have a period of fever up to 102, accompanied by mild shortness of breath.  This was 3 nights after the initial infusion.  Symptoms were overall self limited and resolved by the next day.  He feels at his baseline today.  CT angio study was obtained.  Doing well otherwise with the Vimpat  100mg  BID.  Denies severe headaches, seizures.    H+P (12/16/23) Patient presented to neurologic attention on 11/09/23 with new onset seizures, characterized as flashing lights in the left peripheral vision. CNS imaging demonstrated enhancing mass in the right occipital lobe, c/w primary tumor.  He underwent craniotomy, resection with Dr. Saturnino at [facility] on 11/27/23.  Following surgery, he has had few issues, mostly normal vision.  Is independent with  activities of daily living, no further seizures.  Medications: Current Outpatient Medications on File Prior to Visit  Medication Sig Dispense Refill   acetaminophen (TYLENOL) 325 MG tablet Take 650 mg by mouth every 4 (four) hours as needed.     Lacosamide  100 MG TABS TAKE 1 TABLET (100 MG TOTAL) BY MOUTH IN THE MORNING AND AT BEDTIME. 60 tablet 2   lidocaine -prilocaine  (EMLA ) cream APPLY TOPICALLY 1 APPLICATION AS NEEDED 30 g 0   No current facility-administered medications on file prior to visit.    Allergies: No Known Allergies Past Medical History:  Past Medical History:  Diagnosis Date   Colon cancer (HCC) 2002   Past Surgical History:  Past Surgical History:  Procedure Laterality Date   COLON SURGERY     COLONOSCOPY     EYE SURGERY     trying to correct blurred vision in left eye; unsuccessful   KNEE ARTHROSCOPY     bilat; several surgeries   SHOULDER ARTHROSCOPY     rotator cuff, reattach part of long bicep, Dr Levorn   Social History:  Social History   Socioeconomic History   Marital status: Married    Spouse name: Not on file   Number of children: Not on file   Years of education: Not on file   Highest education level: Bachelor's degree (e.g., BA, AB, BS)  Occupational History   Not on file  Tobacco Use   Smoking status: Never   Smokeless tobacco: Never  Vaping Use   Vaping status: Never Used  Substance and Sexual Activity  Alcohol use: Yes    Comment: 2-3 times weekly, beer   Drug use: No   Sexual activity: Not on file  Other Topics Concern   Not on file  Social History Narrative   Not on file   Social Drivers of Health   Tobacco Use: Medium Risk (10/21/2024)   Received from [facility]   Patient History    Smoking Tobacco Use: Passive Smoke Exposure - Never Smoker    Smokeless Tobacco Use: Never    Passive Exposure: Yes  Financial Resource Strain: Low Risk  (10/09/2024)   Received from [facility]    Overall Financial Resource Strain (CARDIA)    Difficulty of Paying Living Expenses: Not hard at all  Food Insecurity: No Food Insecurity (10/09/2024)   Received from [facility]   Epic    Within the past 12 months, you worried that your food would run out before you got the money to buy more.: Never true    Within the past 12 months, the food you bought just didn't last and you didn't have money to get more.: Never true  Transportation Needs: No Transportation Needs (10/09/2024)   Received from [facility] - Transportation    In the past 12 months, has lack of transportation kept you from medical appointments or from getting medications?: No    Lack of Transportation (Non-Medical): No  Physical Activity: Sufficiently Active (07/22/2024)   Exercise Vital Sign    Days of Exercise per Week: 6 days    Minutes of Exercise per Session: 80 min  Stress: Stress Concern Present (07/22/2024)   [employer] of Occupational [employer] - Occupational Stress Questionnaire    Feeling of Stress: To some extent  Social Connections: Moderately Integrated (07/22/2024)   Social Connection and Isolation Panel    Frequency of Communication with Friends and Family: More than three times a week    Frequency of Social Gatherings with Friends and Family: Once a week    Attends Religious Services: 1 to 4 times per year    Active Member of [employer] or Organizations: No    Attends Engineer, Structural: Not on file    Marital Status: Married  Catering Manager Violence: Not At Risk (11/17/2023)   Received from [facility]   HITS    Over the last 12 months how often did your partner physically hurt you?: Never    Over the last 12 months how often did your partner insult you or talk down to you?: Never    Over the last 12 months how often did your partner threaten you with physical harm?: Never    Over the last 12 months how often did your partner scream or curse at  you?: Never  Depression (PHQ2-9): Low Risk (11/16/2024)   Depression (PHQ2-9)    PHQ-2 Score: 0  Alcohol Screen: Low Risk (07/22/2024)   Alcohol Screen    Last Alcohol Screening Score (AUDIT): 3  Housing: Low Risk  (10/09/2024)   Received from [facility]   Epic    In the last 12 months, was there a time when you were not able to pay the mortgage or rent on time?: No    In the past 12 months, how many times have you moved where you were living?: 0    At any time in the past 12 months, were you homeless or living in a shelter (including now)?: No  Utilities:  Not At Risk (10/09/2024)   Received from [facility]   Epic    In the past 12 months has the electric, gas, oil, or water company threatened to shut off services in your home?: No  Health Literacy: Not on file   Family History:  Family History  Problem Relation Age of Onset   Cancer Mother        breast   Cancer Father        lung   Colon cancer Neg Hx    Colon polyps Neg Hx    Esophageal cancer Neg Hx    Stomach cancer Neg Hx    Rectal cancer Neg Hx     Review of Systems: Constitutional: Doesn't report fevers, chills or abnormal weight loss Eyes: Doesn't report blurriness of vision Ears, nose, mouth, throat, and face: Doesn't report sore throat Respiratory: Doesn't report cough, dyspnea or wheezes Cardiovascular: Doesn't report palpitation, chest discomfort  Gastrointestinal:  Doesn't report nausea, constipation, diarrhea GU: Doesn't report incontinence Skin: Doesn't report skin rashes Neurological: Per HPI Musculoskeletal: Doesn't report joint pain Behavioral/Psych: Doesn't report anxiety  Physical Exam: Vitals:   12/07/24 0852  BP: 126/77  Pulse: (!) 57  Resp: 20  Temp: (!) 97 F (36.1 C)  SpO2: 99%    KPS: 80. General: Alert, cooperative, pleasant, in no acute distress Head: Normal EENT: No conjunctival injection or scleral icterus.  Lungs: Resp effort  normal Cardiac: Regular rate Abdomen: Non-distended abdomen Skin: No rashes cyanosis or petechiae. Extremities: No clubbing or edema  Neurologic Exam: Mental Status: Awake, alert, attentive to examiner. Oriented to self and environment. Language is fluent with intact comprehension.  Cranial Nerves: Visual acuity is grossly normal. Visual fields are full. Extra-ocular movements intact. No ptosis. Face is symmetric Motor: Tone and bulk are normal. Power is full in both arms and legs. Reflexes are symmetric, no pathologic reflexes present.  Sensory: Intact to light touch Gait: Normal.   Labs: I have reviewed the data as listed    Component Value Date/Time   NA 139 08/31/2024 1003   NA 143 07/26/2024 0832   K 3.9 08/31/2024 1003   CL 107 08/31/2024 1003   CO2 27 08/31/2024 1003   GLUCOSE 90 08/31/2024 1003   BUN 16 08/31/2024 1003   BUN 17 07/26/2024 0832   CREATININE 0.91 08/31/2024 1003   CREATININE 0.91 05/31/2013 1310   CALCIUM  9.2 08/31/2024 1003   PROT 6.5 08/31/2024 1003   PROT 6.5 07/26/2024 0832   ALBUMIN 4.0 08/31/2024 1003   ALBUMIN 4.4 07/26/2024 0832   AST 15 08/31/2024 1003   ALT 12 08/31/2024 1003   ALKPHOS 76 08/31/2024 1003   BILITOT 0.6 08/31/2024 1003   GFRNONAA >60 08/31/2024 1003   GFRNONAA >89 05/31/2013 1310   GFRAA 88 06/02/2020 1444   GFRAA >89 05/31/2013 1310   Lab Results  Component Value Date   WBC 5.1 11/16/2024   NEUTROABS 2.9 11/16/2024   HGB 12.1 (L) 11/16/2024   HCT 36.1 (L) 11/16/2024   MCV 86.4 11/16/2024   PLT 255 11/16/2024     Assessment/Plan Glioblastoma multiforme of occipital lobe (HCC)  Peder Allums is clinically stable today, here for cycle #2 avastin .  Labs are within normal limits.  Syndrome of fever and dyspnea from last week has resolved, CT angio was normal.    We did discuss and recommend proceeding with cycle #2 avastin  7.5mg /kg IV q3 weeks.  Avastin  will be helpful in treatment of inflammation, as steroid  sparing  agent, and possibly as disease modifying drug.  We reviewed potential side effects, including hypertension, bleeding/clotting events, and wound healing impairment.  The patient will have a complete blood count, a comprehensive metabolic panel, and urine protein performed prior to each avastin  infusion. Labs may need to be performed more often.   Avastin  should be held for the following:  ANC less than 500  Platelets less than 50,000  LFT or creatinine greater than 2x ULN  If clinical concerns/contraindications develop  He is scheduled [facility] on Thursday.  Agreeable with continuing Vimpat  100mg  BID in the interim.  We ask that Christepher Melchior return to clinic in 3 weeks for cycle #3 avastin , or sooner if needed.  All questions were answered. The patient knows to call the clinic with any problems, questions or concerns. No barriers to learning were detected.  The total time spent in the encounter was 30 minutes and more than 50% was on counseling and review of test results   Arthea MARLA Manns, MD Medical Director of Neuro-Oncology [facility] at [place] 12/07/2024 8:53 AM "

## 2024-12-14 ENCOUNTER — Other Ambulatory Visit: Payer: Self-pay | Admitting: Internal Medicine

## 2024-12-14 DIAGNOSIS — C719 Malignant neoplasm of brain, unspecified: Secondary | ICD-10-CM

## 2024-12-14 NOTE — Addendum Note (Signed)
 Encounter addended by: Hawkin Charo L on: 12/14/2024 10:05 AM  Actions taken: Imaging Exam ended

## 2024-12-23 ENCOUNTER — Encounter: Payer: Self-pay | Admitting: Internal Medicine

## 2024-12-25 ENCOUNTER — Encounter: Payer: Self-pay | Admitting: Internal Medicine

## 2024-12-28 ENCOUNTER — Encounter: Payer: Self-pay | Admitting: Internal Medicine

## 2024-12-28 ENCOUNTER — Inpatient Hospital Stay: Admitting: Internal Medicine

## 2024-12-28 ENCOUNTER — Other Ambulatory Visit: Payer: Self-pay

## 2024-12-28 ENCOUNTER — Inpatient Hospital Stay

## 2024-12-28 VITALS — BP 129/78 | HR 53 | Temp 97.4°F | Resp 16 | Ht 73.0 in | Wt 204.5 lb

## 2024-12-28 VITALS — BP 126/70 | HR 50

## 2024-12-28 DIAGNOSIS — C714 Malignant neoplasm of occipital lobe: Secondary | ICD-10-CM

## 2024-12-28 DIAGNOSIS — Z5112 Encounter for antineoplastic immunotherapy: Secondary | ICD-10-CM | POA: Diagnosis not present

## 2024-12-28 LAB — CBC WITH DIFFERENTIAL (CANCER CENTER ONLY)
Abs Immature Granulocytes: 0.01 10*3/uL (ref 0.00–0.07)
Basophils Absolute: 0.1 10*3/uL (ref 0.0–0.1)
Basophils Relative: 2 %
Eosinophils Absolute: 0.1 10*3/uL (ref 0.0–0.5)
Eosinophils Relative: 3 %
HCT: 39.7 % (ref 39.0–52.0)
Hemoglobin: 13.2 g/dL (ref 13.0–17.0)
Immature Granulocytes: 0 %
Lymphocytes Relative: 30 %
Lymphs Abs: 1.4 10*3/uL (ref 0.7–4.0)
MCH: 29.1 pg (ref 26.0–34.0)
MCHC: 33.2 g/dL (ref 30.0–36.0)
MCV: 87.6 fL (ref 80.0–100.0)
Monocytes Absolute: 0.6 10*3/uL (ref 0.1–1.0)
Monocytes Relative: 13 %
Neutro Abs: 2.5 10*3/uL (ref 1.7–7.7)
Neutrophils Relative %: 52 %
Platelet Count: 198 10*3/uL (ref 150–400)
RBC: 4.53 MIL/uL (ref 4.22–5.81)
RDW: 14.6 % (ref 11.5–15.5)
WBC Count: 4.7 10*3/uL (ref 4.0–10.5)
nRBC: 0 % (ref 0.0–0.2)

## 2024-12-28 LAB — TOTAL PROTEIN, URINE DIPSTICK: Protein, ur: NEGATIVE mg/dL

## 2024-12-28 MED ORDER — SODIUM CHLORIDE 0.9 % IV SOLN
7.5000 mg/kg | Freq: Once | INTRAVENOUS | Status: AC
  Administered 2024-12-28: 700 mg via INTRAVENOUS
  Filled 2024-12-28: qty 12

## 2024-12-28 MED ORDER — SODIUM CHLORIDE 0.9 % IV SOLN: INTRAVENOUS | Status: DC

## 2024-12-28 NOTE — Patient Instructions (Signed)
 [facility] - A DEPT OF [facility]  Discharge Instructions: Thank you for choosing [facility] to provide your oncology and hematology care.   If you have a lab appointment with the Cancer Center, please go directly to the Cancer Center and check in at the registration area.   Wear comfortable clothing and clothing appropriate for easy access to any Portacath or PICC line.   We strive to give you quality time with your provider. You may need to reschedule your appointment if you arrive late (15 or more minutes).  Arriving late affects you and other patients whose appointments are after yours.  Also, if you miss three or more appointments without notifying the office, you may be dismissed from the clinic at the provider's discretion.      For prescription refill requests, have your pharmacy contact our office and allow 72 hours for refills to be completed.    Today you received the following chemotherapy and/or immunotherapy agents: Bevacizumab -awwb (MVASI )    To help prevent nausea and vomiting after your treatment, we encourage you to take your nausea medication as directed.  BELOW ARE SYMPTOMS THAT SHOULD BE REPORTED IMMEDIATELY: *FEVER GREATER THAN 100.4 F (38 C) OR HIGHER *CHILLS OR SWEATING *NAUSEA AND VOMITING THAT IS NOT CONTROLLED WITH YOUR NAUSEA MEDICATION *UNUSUAL SHORTNESS OF BREATH *UNUSUAL BRUISING OR BLEEDING *URINARY PROBLEMS (pain or burning when urinating, or frequent urination) *BOWEL PROBLEMS (unusual diarrhea, constipation, pain near the anus) TENDERNESS IN MOUTH AND THROAT WITH OR WITHOUT PRESENCE OF ULCERS (sore throat, sores in mouth, or a toothache) UNUSUAL RASH, SWELLING OR PAIN  UNUSUAL VAGINAL DISCHARGE OR ITCHING   Items with * indicate a potential emergency and should be followed up as soon as possible or go to the Emergency Department if any problems should occur.  Please show the CHEMOTHERAPY ALERT CARD or  IMMUNOTHERAPY ALERT CARD at check-in to the Emergency Department and triage nurse.  Should you have questions after your visit or need to cancel or reschedule your appointment, please contact [facility] - A DEPT OF [facility]  Dept: [phone]  and follow the prompts.  Office hours are 8:00 a.m. to 4:30 p.m. Monday - Friday. Please note that voicemails left after 4:00 p.m. may not be returned until the following business day.  We are closed weekends and major holidays. You have access to a nurse at all times for urgent questions. Please call the main number to the clinic Dept: [phone] and follow the prompts.   For any non-urgent questions, you may also contact your provider using MyChart. We now offer e-Visits for anyone 35 and older to request care online for non-urgent symptoms. For details visit [email or web address].   Also download the MyChart app! Go to the app store, search MyChart, open the app, select [place], and log in with your MyChart username and password.

## 2024-12-28 NOTE — Progress Notes (Signed)
 "  Trevor Contreras [address]  [place], [place] [address] [phone]   Interval Evaluation  Date of Service: 12/28/24 Patient Name: Trevor Contreras Patient MRN: [id number] Patient DOB: [birth year] Provider: Arthea MARLA Manns, Trevor Contreras  Identifying Statement:  Trevor Contreras is a 69 y.o. male with right occipital glioblastoma    Oncologic History: Oncology History  Glioblastoma multiforme of occipital lobe (HCC)  11/27/2023 Surgery   Craniotomy, resection of right occipital mass with Dr. Saturnino; path is glioblastoma, IDHwt   12/29/2023 - 02/06/2024 Radiation Therapy   IMRT with concurrent Temodar  75mg /m2 Valene)   03/08/2024 -  Chemotherapy   Initiates adjuvant 5-day Temodar  150-200mg /m2   08/31/2024 Progression   Progression of Disease #1   10/08/2024 Surgery   [facility] clinical trial, CED intratumoral infusion of D2C7 and anti-CD40 ab.   11/16/2024 -  Chemotherapy   Patient is on Treatment Plan : BRAIN ANAPLASTIC GLIOMA, GLIOBLASTOMA Bevacizumab  q21d       Biomarkers:  MGMT Unmethylated.  IDH 1/2 Wild type.  EGFR Unknown  TERT Unknown   Interval History: Trevor Contreras presents today for follow up, cycle #3 avastin .  No recurrence of fever, shortness of breath.  He feels at his baseline today.  Continues to work out daily, energy level is good.  Doing well otherwise with the Vimpat  100mg  BID.  Denies severe headaches, seizures.    H+P (12/16/23) Patient presented to neurologic attention on 11/09/23 with new onset seizures, characterized as flashing lights in the left peripheral vision. CNS imaging demonstrated enhancing mass in the right occipital lobe, c/w primary tumor.  He underwent craniotomy, resection with Dr. Saturnino at [facility] on 11/27/23.  Following surgery, he has had few issues, mostly normal vision.  Is independent with activities of daily living, no further seizures.  Medications: Current Outpatient Medications on File Prior to Visit   Medication Sig Dispense Refill   acetaminophen (TYLENOL) 325 MG tablet Take 650 mg by mouth every 4 (four) hours as needed.     Lacosamide  100 MG TABS TAKE 1 TABLET (100 MG TOTAL) BY MOUTH IN THE MORNING AND AT BEDTIME. 60 tablet 2   lidocaine -prilocaine  (EMLA ) cream APPLY TOPICALLY 1 APPLICATION AS NEEDED 30 g 0   No current facility-administered medications on file prior to visit.    Allergies: No Known Allergies Past Medical History:  Past Medical History:  Diagnosis Date   Colon cancer (HCC) 2002   Past Surgical History:  Past Surgical History:  Procedure Laterality Date   COLON SURGERY     COLONOSCOPY     EYE SURGERY     trying to correct blurred vision in left eye; unsuccessful   KNEE ARTHROSCOPY     bilat; several surgeries   SHOULDER ARTHROSCOPY     rotator cuff, reattach part of long bicep, Dr Levorn   Social History:  Social History   Socioeconomic History   Marital status: Married    Spouse name: Not on file   Number of children: Not on file   Years of education: Not on file   Highest education level: Bachelor's degree (e.g., BA, AB, BS)  Occupational History   Not on file  Tobacco Use   Smoking status: Never   Smokeless tobacco: Never  Vaping Use   Vaping status: Never Used  Substance and Sexual Activity   Alcohol use: Yes    Comment: 2-3 times weekly, beer   Drug use: No   Sexual activity: Not on file  Other  Topics Concern   Not on file  Social History Narrative   Not on file   Social Drivers of Contreras   Tobacco Use: Medium Risk (12/10/2024)   Received from [facility]   Patient History    Smoking Tobacco Use: Passive Smoke Exposure - Never Smoker    Smokeless Tobacco Use: Never    Passive Exposure: Yes  Financial Resource Strain: Low Risk  (10/09/2024)   Received from [facility]   Overall Financial Resource Strain (CARDIA)    Difficulty of Paying Living Expenses: Not hard at all  Food Insecurity: No  Food Insecurity (10/09/2024)   Received from [facility]   Epic    Within the past 12 months, you worried that your food would run out before you got the money to buy more.: Never true    Within the past 12 months, the food you bought just didn't last and you didn't have money to get more.: Never true  Transportation Needs: No Transportation Needs (10/09/2024)   Received from [facility] - Transportation    In the past 12 months, has lack of transportation kept you from medical appointments or from getting medications?: No    Lack of Transportation (Non-Medical): No  Physical Activity: Sufficiently Active (07/22/2024)   Exercise Vital Sign    Days of Exercise per Week: 6 days    Minutes of Exercise per Session: 80 min  Stress: Stress Concern Present (07/22/2024)   Trevor Contreras    Feeling of Stress: To some extent  Social Connections: Moderately Integrated (07/22/2024)   Social Connection and Isolation Panel    Frequency of Communication with Friends and Family: More than three times a week    Frequency of Social Gatherings with Friends and Family: Once a week    Attends Religious Services: 1 to 4 times per year    Active Member of [employer] or Organizations: No    Attends Engineer, Structural: Not on file    Marital Status: Married  Catering Manager Violence: Not At Risk (11/17/2023)   Received from [facility] Contre[facility]   HITS    Over the last 12 months how often did your partner physically hurt you?: Never    Over the last 12 months how often did your partner insult you or talk down to you?: Never    Over the last 12 months how often did your partner threaten you with physical harm?: Never    Over the last 12 months how often did your partner scream or curse at you?: Never  Depression (PHQ2-9): Low Risk (12/07/2024)   Depression (PHQ2-9)    PHQ-2 Score: 0  Alcohol Screen: Low Risk  (07/22/2024)   Alcohol Screen    Last Alcohol Screening Score (AUDIT): 3  Housing: Low Risk  (10/09/2024)   Received from [facility]   Epic    In the last 12 months, was there a time when you were not able to pay the mortgage or rent on time?: No    In the past 12 months, how many times have you moved where you were living?: 0    At any time in the past 12 months, were you homeless or living in a shelter (including now)?: No  Utilities: Not At Risk (10/09/2024)   Received from [facility]   Epic    In the past 12 months has the  electric, gas, oil, or water company threatened to shut off services in your home?: No  Contreras Literacy: Not on file   Family History:  Family History  Problem Relation Age of Onset   Cancer Mother        breast   Cancer Father        lung   Colon cancer Neg Hx    Colon polyps Neg Hx    Esophageal cancer Neg Hx    Stomach cancer Neg Hx    Rectal cancer Neg Hx     Review of Systems: Constitutional: Doesn't report fevers, chills or abnormal weight loss Eyes: Doesn't report blurriness of vision Ears, nose, mouth, throat, and face: Doesn't report sore throat Respiratory: Doesn't report cough, dyspnea or wheezes Cardiovascular: Doesn't report palpitation, chest discomfort  Gastrointestinal:  Doesn't report nausea, constipation, diarrhea GU: Doesn't report incontinence Skin: Doesn't report skin rashes Neurological: Per HPI Musculoskeletal: Doesn't report joint pain Behavioral/Psych: Doesn't report anxiety  Physical Exam: Vitals:   12/28/24 1217  BP: 126/70  Pulse: (!) 50  SpO2: 99%    KPS: 80. General: Alert, cooperative, pleasant, in no acute distress Head: Normal EENT: No conjunctival injection or scleral icterus.  Lungs: Resp effort normal Cardiac: Regular rate Abdomen: Non-distended abdomen Skin: No rashes cyanosis or petechiae. Extremities: No clubbing or edema  Neurologic Exam: Mental Status: Awake,  alert, attentive to examiner. Oriented to self and environment. Language is fluent with intact comprehension.  Cranial Nerves: Visual acuity is grossly normal. Visual fields are full. Extra-ocular movements intact. No ptosis. Face is symmetric Motor: Tone and bulk are normal. Power is full in both arms and legs. Reflexes are symmetric, no pathologic reflexes present.  Sensory: Intact to light touch Gait: Normal.   Labs: I have reviewed the data as listed    Component Value Date/Time   NA 139 08/31/2024 1003   NA 143 07/26/2024 0832   K 3.9 08/31/2024 1003   CL 107 08/31/2024 1003   CO2 27 08/31/2024 1003   GLUCOSE 90 08/31/2024 1003   BUN 16 08/31/2024 1003   BUN 17 07/26/2024 0832   CREATININE 0.91 08/31/2024 1003   CREATININE 0.91 05/31/2013 1310   CALCIUM  9.2 08/31/2024 1003   PROT 6.5 08/31/2024 1003   PROT 6.5 07/26/2024 0832   ALBUMIN 4.0 08/31/2024 1003   ALBUMIN 4.4 07/26/2024 0832   AST 15 08/31/2024 1003   ALT 12 08/31/2024 1003   ALKPHOS 76 08/31/2024 1003   BILITOT 0.6 08/31/2024 1003   GFRNONAA >60 08/31/2024 1003   GFRNONAA >89 05/31/2013 1310   GFRAA 88 06/02/2020 1444   GFRAA >89 05/31/2013 1310   Lab Results  Component Value Date   WBC 4.7 12/28/2024   NEUTROABS 2.5 12/28/2024   HGB 13.2 12/28/2024   HCT 39.7 12/28/2024   MCV 87.6 12/28/2024   PLT 198 12/28/2024     Assessment/Plan Glioblastoma multiforme of occipital lobe (HCC)  Jadakiss Barish is clinically stable today, here for cycle #3 avastin .  Labs are within normal limits.  MRI brain was done on 12/09/24 with [facility] team, still pending receipt of images.  We did discuss and recommend proceeding with cycle #3 avastin  7.5mg /kg IV q3 weeks.  Avastin  will be helpful in treatment of inflammation, as steroid sparing agent, and possibly as disease modifying drug.  We reviewed potential side effects, including hypertension, bleeding/clotting events, and wound healing impairment.  The patient will have a  complete blood count, a comprehensive metabolic panel, and urine protein  performed prior to each avastin  infusion. Labs may need to be performed more often.   Avastin  should be held for the following:  ANC less than 500  Platelets less than 50,000  LFT or creatinine greater than 2x ULN  If clinical concerns/contraindications develop  He is scheduled for CPL injection at [facility] on 01/05/25.  Agreeable with continuing Vimpat  100mg  BID in the interim.  We ask that Rahman Ferrall return to clinic in 3 weeks for cycle #4 avastin , or sooner if needed.  Next MRI brain scheduled for 01/31/25.  All questions were answered. The patient knows to call the clinic with any problems, questions or concerns. No barriers to learning were detected.  The total time spent in the encounter was 30 minutes and more than 50% was on counseling and review of test results   Trevor MARLA Manns, Trevor Contreras Medical Director of Neuro-Oncology [facility] 12/28/24 12:16 PM "

## 2025-01-05 ENCOUNTER — Encounter: Payer: Self-pay | Admitting: Internal Medicine

## 2025-01-05 ENCOUNTER — Other Ambulatory Visit: Payer: Self-pay | Admitting: Internal Medicine

## 2025-01-18 ENCOUNTER — Inpatient Hospital Stay

## 2025-01-18 ENCOUNTER — Inpatient Hospital Stay: Attending: Internal Medicine

## 2025-01-18 ENCOUNTER — Inpatient Hospital Stay: Admitting: Internal Medicine

## 2025-01-31 ENCOUNTER — Other Ambulatory Visit (HOSPITAL_COMMUNITY)

## 2025-02-08 ENCOUNTER — Inpatient Hospital Stay

## 2025-02-08 ENCOUNTER — Inpatient Hospital Stay: Admitting: Internal Medicine

## 2025-02-08 ENCOUNTER — Inpatient Hospital Stay: Attending: Internal Medicine
